# Patient Record
Sex: Female | Born: 1960 | Race: White | Hispanic: No | Smoking: Never smoker
Health system: Southern US, Community
[De-identification: ages and names within clinical notes are randomized; demographics above are authoritative.]

## PROBLEM LIST (undated history)

## (undated) DIAGNOSIS — E039 Hypothyroidism, unspecified: Secondary | ICD-10-CM

## (undated) DIAGNOSIS — F419 Anxiety disorder, unspecified: Secondary | ICD-10-CM

## (undated) DIAGNOSIS — F32A Depression, unspecified: Secondary | ICD-10-CM

## (undated) DIAGNOSIS — Z8719 Personal history of other diseases of the digestive system: Secondary | ICD-10-CM

## (undated) DIAGNOSIS — R200 Anesthesia of skin: Secondary | ICD-10-CM

## (undated) DIAGNOSIS — M179 Osteoarthritis of knee, unspecified: Secondary | ICD-10-CM

## (undated) DIAGNOSIS — K219 Gastro-esophageal reflux disease without esophagitis: Secondary | ICD-10-CM

## (undated) DIAGNOSIS — K649 Unspecified hemorrhoids: Secondary | ICD-10-CM

## (undated) DIAGNOSIS — F329 Major depressive disorder, single episode, unspecified: Secondary | ICD-10-CM

## (undated) DIAGNOSIS — K625 Hemorrhage of anus and rectum: Secondary | ICD-10-CM

## (undated) DIAGNOSIS — K589 Irritable bowel syndrome without diarrhea: Secondary | ICD-10-CM

## (undated) DIAGNOSIS — N200 Calculus of kidney: Secondary | ICD-10-CM

## (undated) DIAGNOSIS — Z87442 Personal history of urinary calculi: Secondary | ICD-10-CM

## (undated) DIAGNOSIS — N3281 Overactive bladder: Secondary | ICD-10-CM

## (undated) DIAGNOSIS — E7800 Pure hypercholesterolemia, unspecified: Secondary | ICD-10-CM

## (undated) DIAGNOSIS — D509 Iron deficiency anemia, unspecified: Secondary | ICD-10-CM

## (undated) DIAGNOSIS — M1710 Unilateral primary osteoarthritis, unspecified knee: Secondary | ICD-10-CM

## (undated) DIAGNOSIS — E785 Hyperlipidemia, unspecified: Secondary | ICD-10-CM

## (undated) DIAGNOSIS — G43909 Migraine, unspecified, not intractable, without status migrainosus: Secondary | ICD-10-CM

## (undated) DIAGNOSIS — K760 Fatty (change of) liver, not elsewhere classified: Secondary | ICD-10-CM

## (undated) HISTORY — DX: Pure hypercholesterolemia, unspecified: E78.00

## (undated) HISTORY — DX: Anxiety disorder, unspecified: F41.9

## (undated) HISTORY — DX: Calculus of kidney: N20.0

## (undated) HISTORY — PX: TUBAL LIGATION: SHX77

## (undated) HISTORY — DX: Major depressive disorder, single episode, unspecified: F32.9

## (undated) HISTORY — DX: Hypothyroidism, unspecified: E03.9

## (undated) HISTORY — DX: Depression, unspecified: F32.A

## (undated) HISTORY — PX: KIDNEY STONE SURGERY: SHX686

## (undated) HISTORY — DX: Hemorrhage of anus and rectum: K62.5

## (undated) HISTORY — DX: Irritable bowel syndrome, unspecified: K58.9

## (undated) HISTORY — PX: COLONOSCOPY: SHX174

---

## 1998-08-03 ENCOUNTER — Ambulatory Visit (HOSPITAL_COMMUNITY): Admission: RE | Admit: 1998-08-03 | Discharge: 1998-08-03 | Payer: Self-pay | Admitting: *Deleted

## 2004-11-25 HISTORY — PX: TOTAL ABDOMINAL HYSTERECTOMY: SHX209

## 2005-11-25 HISTORY — PX: SUPRACERVICAL ABDOMINAL HYSTERECTOMY: SHX5393

## 2009-12-20 ENCOUNTER — Encounter: Admission: RE | Admit: 2009-12-20 | Discharge: 2009-12-20 | Payer: Self-pay | Admitting: Family Medicine

## 2011-06-07 IMAGING — CT CT HEAD W/O CM
5 of 8 series · 17 of 47 positions shown, 19 images · non-contrast
Comparison: None.
COMPARISON: None.

CLINICAL DATA: Right side facial abrasions.  Trauma.

CT HEAD WITHOUT CONTRAST,CT MAXILLOFACIAL WITHOUT CONTRAST
TECHNIQUE: Contiguous axial images were obtained from the base of
the skull through the vertex without contrast.,Technique:
Multidetector CT imaging of the maxillofacial structures was
performed. Multiplanar CT image reconstructions were also
generated.
TECHNIQUE: Multidetector CT imaging of the maxillofacial
structures was performed.  Multiplanar CT image reconstructions
were also generated.  A small metallic BB was placed on the right
temple in order to reliably differentiate right from left.

[Series 2: head w/o · axial · non-contrast · 0.49mm/px · z∈[+53,+99]mm · 2 of 28 slices shown]
[im 10/28  brain]
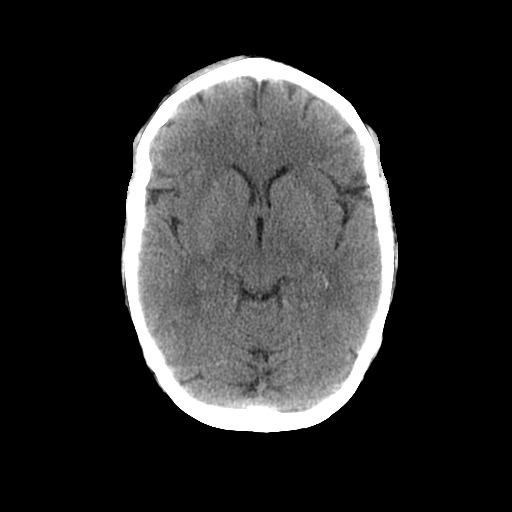
[im 19/28  brain]
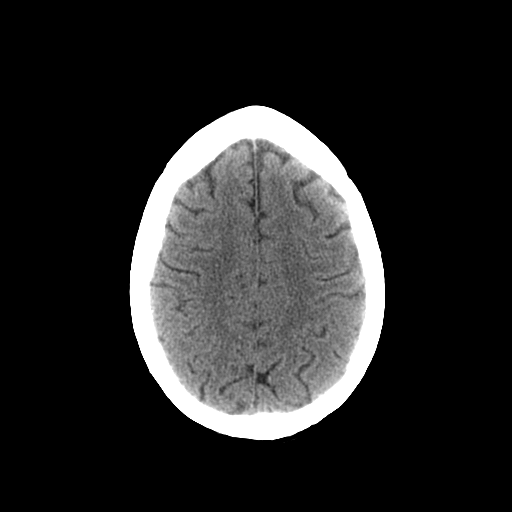

[Series 4: max soft · axial · 0.35mm/px · z∈[-91,+24]mm · 7 of 62 slices shown, 9 images]
[im 8/62  brain]
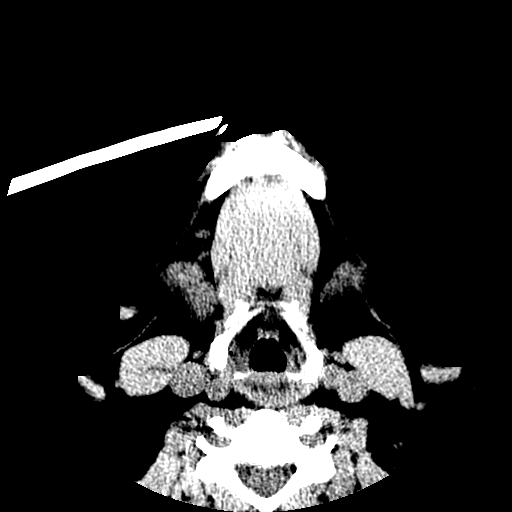
[im 8/62  bone]
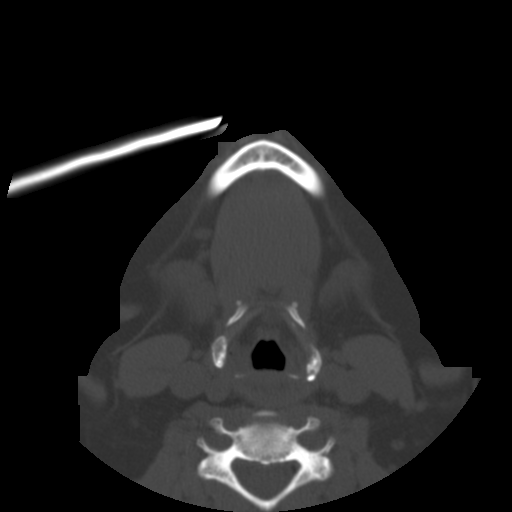
[im 16/62  brain]
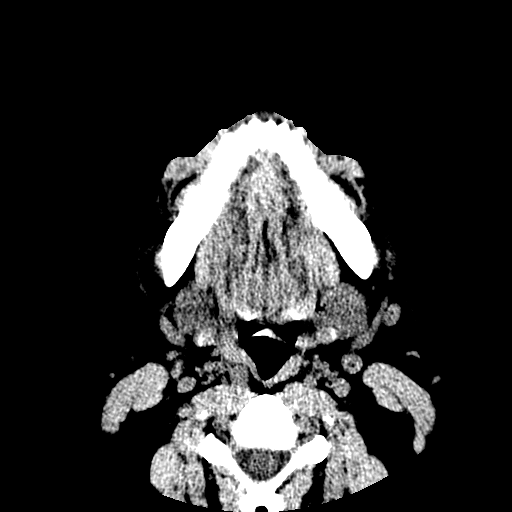
[im 23/62  brain]
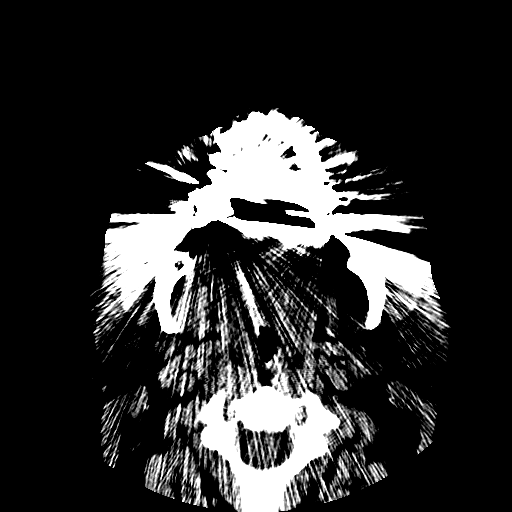
[im 31/62  brain]
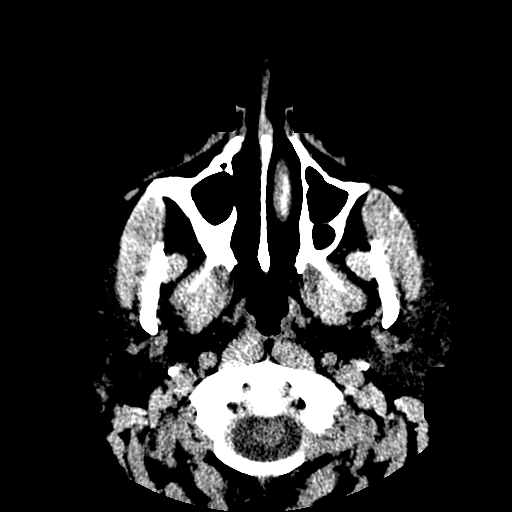
[im 39/62  brain]
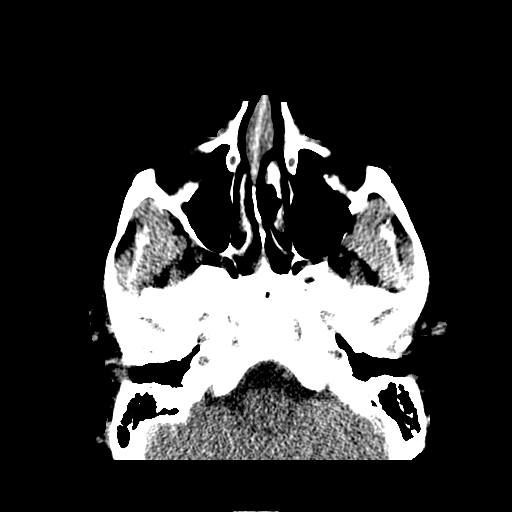
[im 39/62  bone]
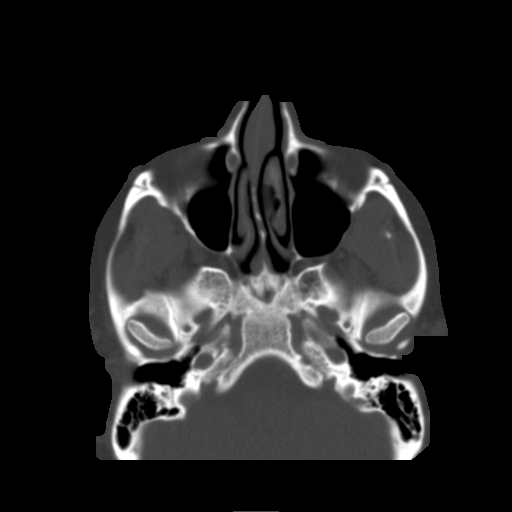
[im 46/62  brain]
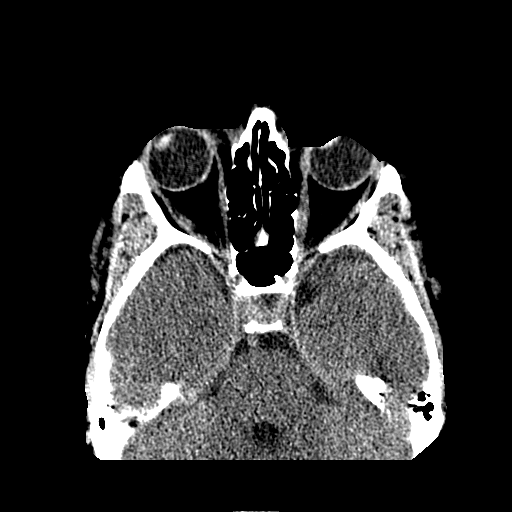
[im 54/62  brain]
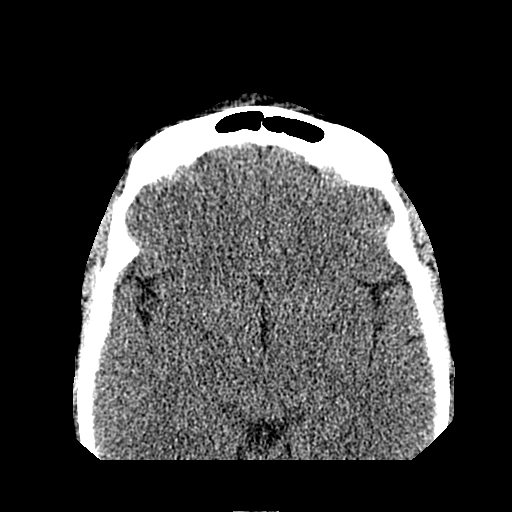

[Series 5: max bone · axial · 0.35mm/px · z∈[-91,-33]mm · 4 of 62 slices shown]
[im 8/62  bone]
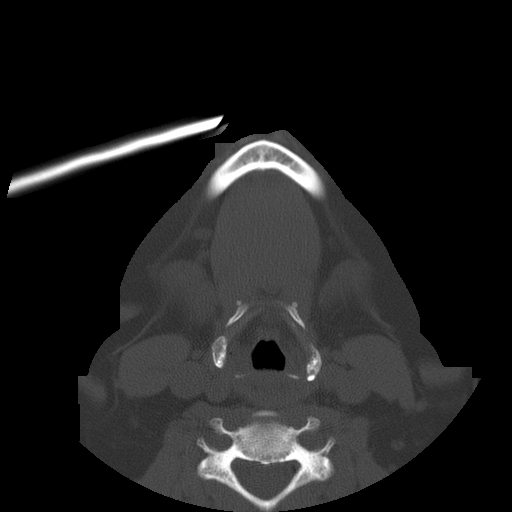
[im 16/62  bone]
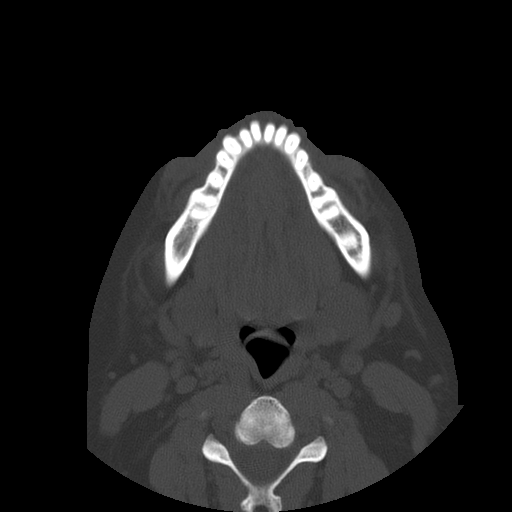
[im 23/62  bone]
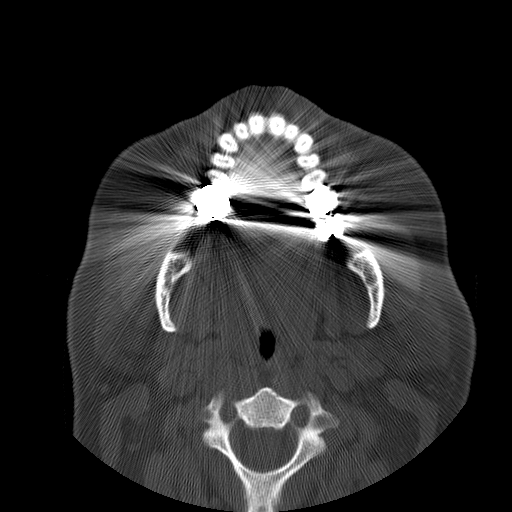
[im 31/62  bone]
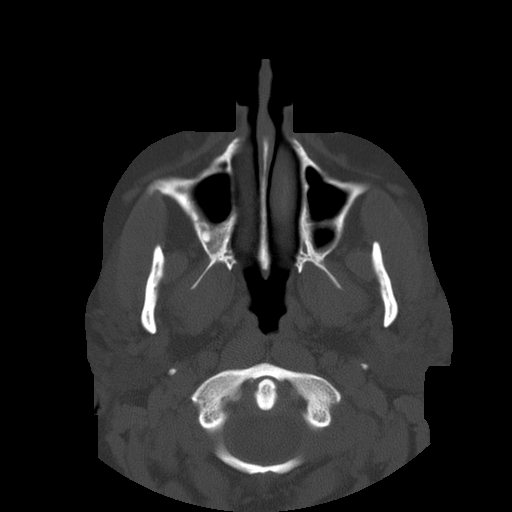

[Series 600: sag soft · sagittal · 0.35mm/px · 1 of 74 slices shown]
[im 37/74  brain]
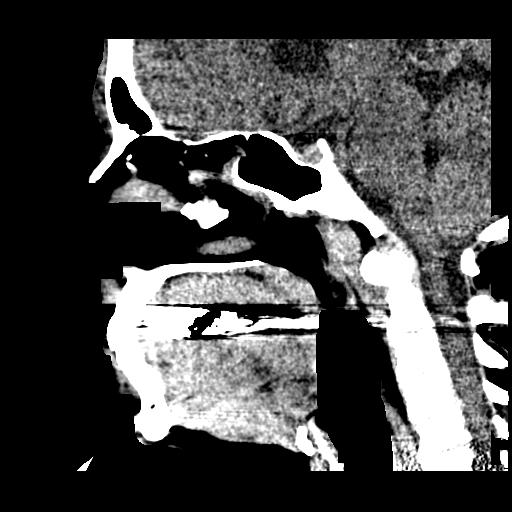

[Series 601: cor soft · coronal · 0.35mm/px · 3 of 78 slices shown]
[im 20/78  brain]
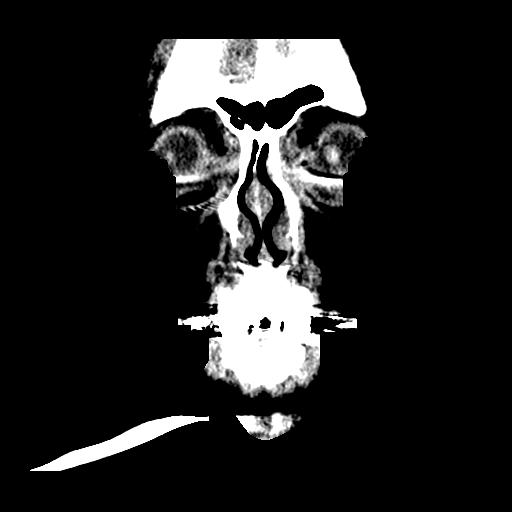
[im 39/78  brain]
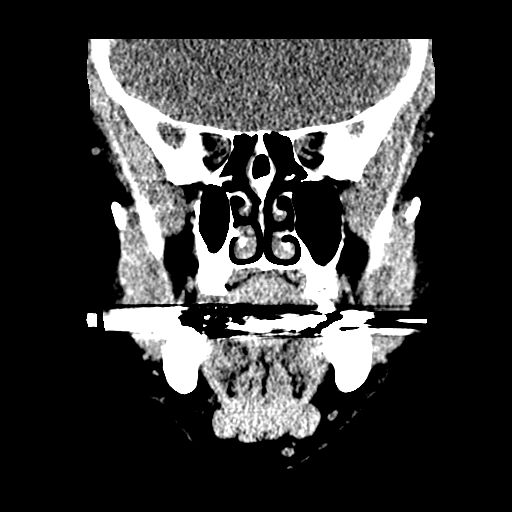
[im 58/78  brain]
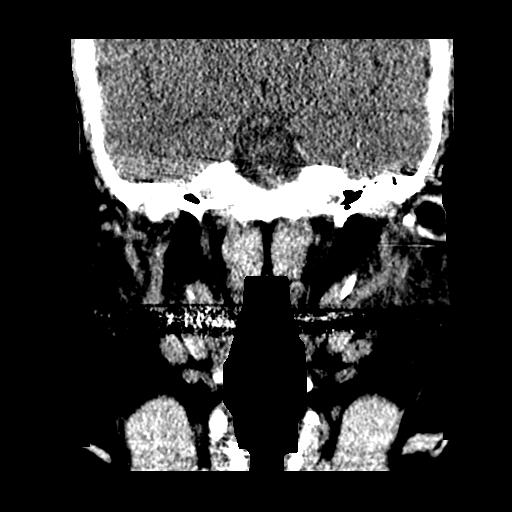

[17 of 47 positions shown; findings below may reference images not displayed]

FINDINGS: No acute intracranial findings.  Negative for hemorrhage,
mass lesion, or acute infarction.  Bony calvarium is intact.
Frontal scalp soft tissue swelling.
IMPRESSION: No acute intracranial abnormality.

CT MAXILLOFACIAL WITHOUT CONTRAST
FINDINGS: Frontal scalp soft tissue swelling.  No underlying
frontal bone fracture.  Bilateral maxillary sinus mucosal
thickening.  No sinus air fluid levels.  No fracture or bony
displacement.
IMPRESSION: No acute facial bone abnormality.

## 2016-05-09 ENCOUNTER — Ambulatory Visit: Payer: Self-pay | Admitting: Physician Assistant

## 2016-05-14 ENCOUNTER — Encounter: Payer: Self-pay | Admitting: Cardiovascular Disease

## 2016-05-20 ENCOUNTER — Encounter: Payer: Self-pay | Admitting: Cardiovascular Disease

## 2016-05-20 ENCOUNTER — Ambulatory Visit (INDEPENDENT_AMBULATORY_CARE_PROVIDER_SITE_OTHER): Payer: BLUE CROSS/BLUE SHIELD | Admitting: Cardiovascular Disease

## 2016-05-20 ENCOUNTER — Encounter (INDEPENDENT_AMBULATORY_CARE_PROVIDER_SITE_OTHER): Payer: Self-pay

## 2016-05-20 VITALS — BP 130/74 | HR 97 | Ht 62.0 in | Wt 203.4 lb

## 2016-05-20 DIAGNOSIS — E785 Hyperlipidemia, unspecified: Secondary | ICD-10-CM | POA: Insufficient documentation

## 2016-05-20 NOTE — Patient Instructions (Signed)
Medication Instructions:  Your physician recommends that you continue on your current medications as directed. Please refer to the Current Medication list given to you today.   Labwork: None Ordered   Testing/Procedures: None Ordered   Follow-Up: Your physician recommends that you schedule a follow-up appointment in: Lipid Clinic for management for your high cholesterol  Your physician wants you to follow-up in: 1 year with Dr. Elease HashimotoNahser.  You will receive a reminder letter in the mail two months in advance. If you don't receive a letter, please call our office to schedule the follow-up appointment.   If you need a refill on your cardiac medications before your next appointment, please call your pharmacy.   Thank you for choosing [facility]! Eligha BridegroomMichelle Makena Mcgrady, RN [phone]

## 2016-05-20 NOTE — Progress Notes (Signed)
Cardiology Office Note   Date:  05/20/2016   ID:  Destiny Jacobson, DOB [birth year], MRN [id number]  PCP:  REDMON,NOELLE, PA-C  Cardiologist:   Kristeen MissPhilip Jeweliana Dudgeon, MD   Chief Complaint  Patient presents with  . Hyperlipidemia   Problem list 1. Hyperlipidemia 2. Hypothyroidism   History of Present Illness: Destiny Jacobson is a 55 y.o. female who presents for Hyperlipidemia.   Destiny Jacobson is seen today for further evaluation of hyperlipidemia. She's been tried on Crestor in the past but had lots of leg pain. She also has been tried on Zetia.  [facility] a  cholesterol level of 299.  Triglyceride level is 279.    HDL is 53. calculated LDL is 190.    non-HDL is [id number].  Tries to watch her diet  Tries to work out but does not go to [employer]  Works as a Conservation officer, naturecashier at [employer]   Past Medical History:  Diagnosis Date  . Anxiety and depression   . Elevated cholesterol   . Hypothyroidism   . IBS (irritable bowel syndrome)   . Kidney stones   . Rectal bleeding     Past Surgical History:  Procedure Laterality Date  . KIDNEY STONE SURGERY  2013 OR 2014  . TOTAL ABDOMINAL HYSTERECTOMY  11/2004   [place]  . TUBAL LIGATION       Current Outpatient Prescriptions  Medication Sig Dispense Refill  . ezetimibe (ZETIA) 10 MG tablet Take 10 mg by mouth daily.    Marland Kitchen. FLUoxetine (PROZAC) 20 MG capsule Take 20 mg by mouth 3 (three) times daily.    . hydrocortisone (ANUSOL-HC) 25 MG suppository Place 25 mg rectally 2 (two) times daily as needed for hemorrhoids or itching.    . levothyroxine (SYNTHROID, LEVOTHROID) 175 MCG tablet Take 175 mcg by mouth daily before breakfast.    . Multiple Vitamin (MULTIVITAMIN) tablet Take 1 tablet by mouth daily.    . rosuvastatin (CRESTOR) 40 MG tablet Take 40 mg by mouth daily.    . Wheat Dextrin (BENEFIBER) POWD Take by mouth as directed.    . zolpidem (AMBIEN) 10 MG tablet Take 10 mg by mouth at bedtime as  needed for sleep.     No current facility-administered medications for this visit.     Allergies:   Amoxicillin and Valacyclovir hcl    Social History:  The patient  reports that she has never smoked. She has never used smokeless tobacco. She reports that she drinks alcohol. She reports that she does not use drugs.   Family History:  The patient's family history includes CAD in her mother; Heart attack (age of onset: 1069) in her mother; Hyperlipidemia in her sister.    ROS:  Please see the history of present illness.    Review of Systems: Constitutional:  denies fever, chills, diaphoresis, appetite change and fatigue.  HEENT: denies photophobia, eye pain, redness, hearing loss, ear pain, congestion, sore throat, rhinorrhea, sneezing, neck pain, neck stiffness and tinnitus.  Respiratory: denies SOB, DOE, cough, chest tightness, and wheezing.  Cardiovascular: denies chest pain, palpitations and leg swelling.  Gastrointestinal: denies nausea, vomiting, abdominal pain, diarrhea, constipation, blood in stool.  Genitourinary: denies dysuria, urgency, frequency, hematuria, flank pain and difficulty urinating.  Musculoskeletal: denies  myalgias, back pain, joint swelling, arthralgias and gait problem.   Skin: denies pallor, rash and wound.  Neurological: denies dizziness, seizures, syncope, weakness, light-headedness, numbness and headaches.   Hematological: denies adenopathy, easy bruising,  personal or family bleeding history.  Psychiatric/ Behavioral: denies suicidal ideation, mood changes, confusion, nervousness, sleep disturbance and agitation.       All other systems are reviewed and negative.    PHYSICAL EXAM: VS:  BP 130/74 (BP Location: Left Leg, Patient Position: Sitting, Cuff Size: Normal) Comment: Right/Regular/Sitting 120/50  Pulse 97   Ht 5\' 2"  (1.575 m)   Wt 203 lb 6.4 oz (92.3 kg)   SpO2 94%   BMI 37.20 kg/m  , BMI Body mass index is 37.2 kg/m. GEN: Obese female in  no acute distress  HEENT: normal  Neck: no JVD, carotid bruits, or masses Cardiac: RRR; no murmurs, rubs, or gallops,no edema  Respiratory:  clear to auscultation bilaterally, normal work of breathing GI: Her abdomen is obese. soft, nontender, nondistended, + BS MS: no deformity or atrophy  Skin: warm and dry, no rash Neuro:  Strength and sensation are intact Psych: normal   EKG:  EKG is ordered today. The ekg ordered today demonstrates normal sinus rhythm at 98. She has an incomplete right bundle-branch block.   Recent Labs: No results found for requested labs within last 8760 hours.    Lipid Panel No results found for: CHOL, TRIG, HDL, CHOLHDL, VLDL, LDLCALC, LDLDIRECT    Wt Readings from Last 3 Encounters:  05/20/16 203 lb 6.4 oz (92.3 kg)      Other studies Reviewed: Additional studies/ records that were reviewed today include: . Review of the above records demonstrates:    ASSESSMENT AND PLAN:  1.  Hyperlipidemia: Destiny Jacobson presents for further evaluation of hyperlipidemia. She has not been able to tolerate statins. We will refer her to our lipid clinic for consideration for repacking.  We have discussed the importance of a good diet, exercise, and weight loss program. I think that she has some room to improve in those  areas.  I'll see her in one year.   Current medicines are reviewed at length with the patient today.  The patient does not have concerns regarding medicines.  Labs/ tests ordered today include:  No orders of the defined types were placed in this encounter.    Disposition:   FU with me in 1 year      Kristeen Miss, MD  05/20/2016 4:12 PM    [facility] [address] [place], [place], [place]  [address] Phone: [phone]; Fax: [phone]   [facility]  [address] [place], [place]  [address] [phone]   Fax [phone]

## 2016-05-21 NOTE — Progress Notes (Deleted)
Patient ID: Destiny SpanJacqueline M Accomando                 DOB: November 23, 1960                    MRN: [id number]     HPI: Destiny Jacobson is a 55 y.o. female patient referred to lipid clinic by Dr. Elease HashimotoNahser. PMH is significant for HLD and anxiety/depression.  Other statins tried? Zetia and Crestor 40mg  still listed on med list  [employer] insurance  Current Medications:  Intolerances: Crestor, Zetia - leg myalgias Risk Factors:  LDL goal:   Diet:   Exercise:   Family History: The patient's family history includes CAD in her mother; Heart attack (age of onset: 8469) in her mother; Hyperlipidemia in her sister.   Social History: The patient  reports that she has never smoked. She has never used smokeless tobacco. She reports that she drinks alcohol. She reports that she does not use drugs.   Labs: TC 299, TG 279, HDL 53, LDL 190, non-HDL 245  Past Medical History:  Diagnosis Date  . Anxiety and depression   . Elevated cholesterol   . Hypothyroidism   . IBS (irritable bowel syndrome)   . Kidney stones   . Rectal bleeding     Current Outpatient Prescriptions on File Prior to Visit  Medication Sig Dispense Refill  . ezetimibe (ZETIA) 10 MG tablet Take 10 mg by mouth daily.    Marland Kitchen. FLUoxetine (PROZAC) 20 MG capsule Take 20 mg by mouth 3 (three) times daily.    . hydrocortisone (ANUSOL-HC) 25 MG suppository Place 25 mg rectally 2 (two) times daily as needed for hemorrhoids or itching.    . levothyroxine (SYNTHROID, LEVOTHROID) 175 MCG tablet Take 175 mcg by mouth daily before breakfast.    . Multiple Vitamin (MULTIVITAMIN) tablet Take 1 tablet by mouth daily.    . rosuvastatin (CRESTOR) 40 MG tablet Take 40 mg by mouth daily.    . Wheat Dextrin (BENEFIBER) POWD Take by mouth as directed.    . zolpidem (AMBIEN) 10 MG tablet Take 10 mg by mouth at bedtime as needed for sleep.     No current facility-administered medications on file prior to visit.     Allergies  Allergen Reactions  .  Amoxicillin Diarrhea and Nausea And Vomiting  . Valacyclovir Hcl Other (See Comments)    ABDOMINAL PAIN     Assessment/Plan:

## 2016-05-22 ENCOUNTER — Ambulatory Visit: Payer: BLUE CROSS/BLUE SHIELD | Admitting: Pharmacist

## 2016-06-11 ENCOUNTER — Ambulatory Visit: Payer: BLUE CROSS/BLUE SHIELD | Admitting: Cardiovascular Disease

## 2016-07-02 ENCOUNTER — Ambulatory Visit: Payer: BLUE CROSS/BLUE SHIELD | Admitting: Cardiovascular Disease

## 2016-07-09 ENCOUNTER — Encounter (INDEPENDENT_AMBULATORY_CARE_PROVIDER_SITE_OTHER): Payer: Self-pay

## 2016-07-09 ENCOUNTER — Ambulatory Visit (INDEPENDENT_AMBULATORY_CARE_PROVIDER_SITE_OTHER): Payer: BLUE CROSS/BLUE SHIELD | Admitting: Pharmacist

## 2016-07-09 DIAGNOSIS — E785 Hyperlipidemia, unspecified: Secondary | ICD-10-CM | POA: Diagnosis not present

## 2016-07-09 MED ORDER — ROSUVASTATIN CALCIUM 10 MG PO TABS
10.0000 mg | ORAL_TABLET | Freq: Every day | ORAL | 11 refills | Status: AC
Start: 2016-07-09 — End: 2016-10-07

## 2016-07-09 NOTE — Progress Notes (Addendum)
Patient ID: Destiny SpanJacqueline M Doubleday                 DOB: 11/04/1960                    MRN: [id number]     HPI: Destiny Jacobson is a 55 y.o. female patient referred to lipid clinic by Dr Elease HashimotoNahser. PMH is significant for HLD, anxiety and depression, IBS, and hypothyroidism. She has a history of statin intolerance and baseline LDL of 190. She presents today for lipid management.  Pt reports that her PCP started her on Crestor 40mg  and Zetia 10mg  at the same time. She started to experience muscle aches about week after. They resolved once pt discontinued therapy. She has never tried any other statin medications or lower dose of Crestor.  She has been trying to eat healthier. Has cut back on soda and red meat and has started to drink more water and eat more salads.  Current Medications: Benefiber powder Intolerances: Crestor 40mg  and Zetia 10mg  daily - leg aches Risk Factors: LDL of 190, family history of CAD LDL goal: 100mg /dL  Diet: Drinking more water and eating more salads. Has cut back on soda and red meat.  Exercise: Some knee problems - limits activity level.  Family History: The patient's family history includes CAD in her mother; Heart attack (age of onset: 4669) in her mother; Hyperlipidemia in her sister.   Social History: The patient  reports that she has never smoked. She has never used smokeless tobacco. She reports that she drinks alcohol. She reports that she does not use drugs.  Labs: [facility]: TC 299, TG 279, HDL 53, LDL 190, non-HDL 245  Past Medical History:  Diagnosis Date  . Anxiety and depression   . Elevated cholesterol   . Hypothyroidism   . IBS (irritable bowel syndrome)   . Kidney stones   . Rectal bleeding     Current Outpatient Prescriptions on File Prior to Visit  Medication Sig Dispense Refill  . ezetimibe (ZETIA) 10 MG tablet Take 10 mg by mouth daily.    Marland Kitchen. FLUoxetine (PROZAC) 20 MG capsule Take 20 mg by mouth 3  (three) times daily.    . hydrocortisone (ANUSOL-HC) 25 MG suppository Place 25 mg rectally 2 (two) times daily as needed for hemorrhoids or itching.    . levothyroxine (SYNTHROID, LEVOTHROID) 175 MCG tablet Take 175 mcg by mouth daily before breakfast.    . Multiple Vitamin (MULTIVITAMIN) tablet Take 1 tablet by mouth daily.    . rosuvastatin (CRESTOR) 40 MG tablet Take 40 mg by mouth daily.    . Wheat Dextrin (BENEFIBER) POWD Take by mouth as directed.    . zolpidem (AMBIEN) 10 MG tablet Take 10 mg by mouth at bedtime as needed for sleep.     No current facility-administered medications on file prior to visit.     Allergies  Allergen Reactions  . Amoxicillin Diarrhea and Nausea And Vomiting  . Valacyclovir Hcl Other (See Comments)    ABDOMINAL PAIN     Assessment/Plan:  1. Hyperlipidemia - LDL 190mg /dL above goal 100mg /dL for primary prevention with family hx of CAD. Pt was intolerant to Crestor 40mg  and Zetia 10mg  daily which were started at the same time. She wanted to try PCSK9i however insurance will not cover injections since she does not have ASCVD or FH. She has never tried a lower dose of Crestor or any other statins. She  is willing to try Crestor 10mg  daily. Will continue to work on dietary improvements. Recheck lipid panel in 3 months.   Megan E. Supple, PharmD, CPP, BCACP [facility] [address], [place], [place] [address] Phone: [phone]; Fax: [phone] 07/09/2016 11:52 AM

## 2016-07-09 NOTE — Patient Instructions (Signed)
Start taking Crestor (rosuvastatin) 10mg  daily  We will recheck your cholesterol in 2 months on Monday February 19th. Come in any time between 7:30am and 4pm for fasting blood work.  Call Jamaris Biernat in lipid clinic with any questions #[phone].

## 2016-07-14 ENCOUNTER — Telehealth: Payer: Self-pay | Admitting: Pharmacist

## 2016-07-14 NOTE — Telephone Encounter (Signed)
Received call from [facility] with more details of lipid-lowering therapy pt had previously tried. They reported that pt was on Lipitor 80mg  daily from 2009 - 2011. Then she was switched to Crestor because of lack of efficacy of the Lipitor. Crestor was titrated up from 10mg  to 20mg  to 40mg  daily. Zetia was then added in 2011. No mention of myalgias from PCP. Question patient's adherence because there should have been a significant drop in LDL if she had been adherent to either max dose Crestor or Lipitor.  Pt had reported that she was started on Crestor 40mg  and Zetia 10mg  at the same time and that she had not tried a lower dose of Crestor. She was willing retry lower dose of Crestor 10mg  daily. Will keep current lab appt for February and assess LDL at that time. May try up-titrating Crestor again at that time pending lab results.

## 2016-07-28 DIAGNOSIS — M1712 Unilateral primary osteoarthritis, left knee: Secondary | ICD-10-CM | POA: Insufficient documentation

## 2016-09-15 ENCOUNTER — Other Ambulatory Visit: Payer: BLUE CROSS/BLUE SHIELD

## 2016-09-19 ENCOUNTER — Telehealth: Payer: Self-pay | Admitting: Pharmacist

## 2016-09-19 NOTE — Telephone Encounter (Signed)
LMOM for pt to return call regarding missed lab appt to check lipids on Crestor 10mg  daily.

## 2016-10-01 NOTE — Telephone Encounter (Signed)
LMOM again for pt to return call to reschedule missed lab work.

## 2018-02-05 DIAGNOSIS — M1711 Unilateral primary osteoarthritis, right knee: Secondary | ICD-10-CM | POA: Insufficient documentation

## 2018-06-14 ENCOUNTER — Ambulatory Visit: Payer: Self-pay | Admitting: Surgery

## 2018-06-14 NOTE — H&P (Signed)
Destiny Jacobson Documented: 06/14/2018 8:36 AM Location: [facility] Patient #: [id number] DOB: 04-Jun-1961 Single / Language: [place] / Race: White Female  History of Present Illness Destiny Jacobson; 06/14/2018 2:16 PM) The patient is a 57 year old female who presents with hemorrhoids. Note for "Hemorrhoids": ` ` ` Patient sent for surgical consultation at the request of Destiny Jacobson, [place]  Chief Complaint: worsening hemorrhoids ` ` The patient is a pleasant woman history it was hemorrhoids for many decades per essentially since she had her child. Usually has some intermittent bleeding but mostly manageable. Followed by Dr. Dulce Jacobson with gastroenterology. Colonoscopy about 5 years ago was underwhelming. Moves her bowels about every other day. And intermittent bleeding hemorrhoids. Surgical consultation requested. Absorbable my partner, Destiny Jacobson, 2 years ago. External hemorrhoids noted. Fiber bowel regimen recommended in holding off on surgical intervention. Patient notes that she was using pads and topical agents to try and manage it. However to progressively worsen. Feels like there is something out all the time. Wearing diapers. Bleeding. Has had severe bleeding episodes at work. Frustrating and embarrassing. Some discomfort with bowel movements and stinging. Based on concerns, surgical consultation requested.  No personal nor family history of GI/colon cancer, inflammatory bowel disease, irritable bowel syndrome, allergy such as Celiac Sprue, dietary/dairy problems, colitis, ulcers nor gastritis. No recent sick contacts/gastroenteritis. No travel outside the country. No changes in diet. No dysphagia to solids or liquids. No significant heartburn or reflux. No melena, hematemesis, coffee ground emesis. No evidence of prior gastric/peptic ulceration.  (Review of systems as stated in this history (HPI) or in the review of systems. Otherwise all other  12 point ROS are negative) ` ` `   Allergies Destiny Jacobson, [place]; 06/14/2018 8:37 AM) Amoxicillin *PENICILLINS* Allergies Reconciled  Medication History Destiny Jacobson; 06/14/2018 8:38 AM) Zolpidem Tartrate (10MG  Tablet, Oral) Active. Procto-Med HC (2.5% Cream, 1 (one) Rectal two times daily, Taken starting 12/12/2015) Active. FLUoxetine HCl (20MG  Capsule, Oral) Active. Levothyroxine Sodium ( Tablet, Oral) Active. Fish Oil + D3 (1000-1000MG -UNIT Capsule, Oral) Active. Crestor (10MG  Tablet, Oral) Active. Medications Reconciled    Vitals Destiny Jacobson; 06/14/2018 8:37 AM) 06/14/2018 8:37 AM Weight: 205.5 lb Jacobson: 62in Body Surface Area: 1.93 m Body Mass Index: 37.59 kg/m  Temp.: 98.43F  Pulse: 96 (Regular)  BP: 132/80 (Sitting, Left Arm, Standard)      Physical Exam Destiny Jacobson; 06/14/2018 9:32 AM)  General Mental Status-Alert. General Appearance-Not in acute distress, Not Sickly. Orientation-Oriented X3. Hydration-Well hydrated. Voice-Normal.  Integumentary Global Assessment Upon inspection and palpation of skin surfaces of the - Axillae: non-tender, no inflammation or ulceration, no drainage. and Distribution of scalp and body hair is normal. General Characteristics Temperature - normal warmth is noted.  Head and Neck Head-normocephalic, atraumatic with no lesions or palpable masses. Face Global Assessment - atraumatic, no absence of expression. Neck Global Assessment - no abnormal movements, no bruit auscultated on the right, no bruit auscultated on the left, no decreased range of motion, non-tender. Trachea-midline. Thyroid Gland Characteristics - non-tender.  Eye Eyeball - Left-Extraocular movements intact, No Nystagmus. Eyeball - Right-Extraocular movements intact, No Nystagmus. Cornea - Left-No Hazy. Cornea - Right-No Hazy. Sclera/Conjunctiva - Left-No scleral icterus, No  Discharge. Sclera/Conjunctiva - Right-No scleral icterus, No Discharge. Pupil - Left-Direct reaction to light normal. Pupil - Right-Direct reaction to light normal.  ENMT Ears Pinna - Left - no drainage observed, no generalized tenderness observed. Right - no drainage observed, no generalized tenderness  observed. Nose and Sinuses External Inspection of the Nose - no destructive lesion observed. Inspection of the nares - Left - quiet respiration. Right - quiet respiration. Mouth and Throat Lips - Upper Lip - no fissures observed, no pallor noted. Lower Lip - no fissures observed, no pallor noted. Nasopharynx - no discharge present. Oral Cavity/Oropharynx - Tongue - no dryness observed. Oral Mucosa - no cyanosis observed. Hypopharynx - no evidence of airway distress observed.  Chest and Lung Exam Inspection Movements - Normal and Symmetrical. Accessory muscles - No use of accessory muscles in breathing. Palpation Palpation of the chest reveals - Non-tender. Auscultation Breath sounds - Normal and Clear.  Cardiovascular Auscultation Rhythm - Regular. Murmurs & Other Heart Sounds - Auscultation of the heart reveals - No Murmurs and No Systolic Clicks.  Abdomen Inspection Inspection of the abdomen reveals - No Visible peristalsis and No Abnormal pulsations. Umbilicus - No Bleeding, No Urine drainage. Palpation/Percussion Palpation and Percussion of the abdomen reveal - Soft, Non Tender, No Rebound tenderness, No Rigidity (guarding) and No Cutaneous hyperesthesia. Note: Morbidly obese but nontender. Mild diastases recti. Abdomen soft. Not severely distended. Low midline incision consistent with prior partial hysterectomy and oophorectomy. No umbilical or other anterior abdominal wall hernias  Female Genitourinary Sexual Maturity Tanner 5 - Adult hair pattern. Note: No vaginal bleeding nor discharge. No major inguinal lymphadenopathy. No inguinal hernias  Rectal Note: Please  refer to anoscopy section.  Overall, 3 pile enlarged hemorrhoids with significant external component. Right posterior grade 4. Left lateral grade through easily prolapse. Grade 2 at least possible 3 right anterior as well. No fissure. Normal sphincter tone. No abscess. No major pruritus. No obvious rectal masses felt to 7 cm on exam. No stricture.  Peripheral Vascular Upper Extremity Inspection - Left - No Cyanotic nailbeds, Not Ischemic. Right - No Cyanotic nailbeds, Not Ischemic.  Neurologic Neurologic evaluation reveals -normal attention span and ability to concentrate, able to name objects and repeat phrases. Appropriate fund of knowledge , normal sensation and normal coordination. Mental Status Affect - not angry, not paranoid. Cranial Nerves-Normal Bilaterally. Gait-Normal.  Neuropsychiatric Mental status exam performed with findings of-able to articulate well with normal speech/language, rate, volume and coherence, thought content normal with ability to perform basic computations and apply abstract reasoning and no evidence of hallucinations, delusions, obsessions or homicidal/suicidal ideation.  Musculoskeletal Global Assessment Spine, Ribs and Pelvis - no instability, subluxation or laxity. Right Upper Extremity - no instability, subluxation or laxity.  Lymphatic Head & Neck  General Head & Neck Lymphatics: Bilateral - Description - No Localized lymphadenopathy. Axillary  General Axillary Region: Bilateral - Description - No Localized lymphadenopathy. Femoral & Inguinal  Generalized Femoral & Inguinal Lymphatics: Left - Description - No Localized lymphadenopathy. Right - Description - No Localized lymphadenopathy.   Results Destiny Jacobson; 06/14/2018 2:17 PM) Procedures  Name Value Date Hemorrhoids Procedure Anal exam: External Hemorrhoid prolapse Internal exam: Internal Hemorrhoids (bleeding) prolapse Other: Overall, 3 pile  enlarged hemorrhoids with significant external component. Right posterior grade 4. Left lateral grade through easily prolapse. Grade 2 at least possible 3 right anterior as well.....Marland KitchenMarland KitchenNo fissure. Normal sphincter tone. No abscess. No major pruritus. No obvious rectal masses felt to 7 cm on exam. No stricture.  Performed: 06/14/2018 9:33 AM    Assessment & Plan Destiny Jacobson; 06/14/2018 2:17 PM)  PROLAPSED INTERNAL HEMORRHOIDS, GRADE 4 (K64.3) Impression: Worsening hemorrhoids with right posterior chronically prolapsed out. Left lateral prolapses easily. Right anterior almost does  as well. Significant external piles. No other etiologies.  Because she has worsening bleeding despite decent bowel regimen and prior evaluation with colonoscopy showing no other major etiologies, I think this requires intervention. These are too large and prolapsed for banding to be effective. Banding usually less effective against bleeding hemorrhoids anyway. I think this requires surgery. Hexagonal columnar hemorrhoidal ligation and pexy with hemorrhoidectomy of any remaining tissue. Suspect at least 1 if not 2 piles would require some partial excision as well to get Hernando be due better place. I did caution she was pain or discomfort with this but it it is what is needed to get her control. She suspected as much. She has family members and needed this. We will work to coordinate a convenient time   INTERNAL BLEEDING HEMORRHOIDS (K64.8)   PROLAPSED INTERNAL HEMORRHOIDS, GRADE 3 (K64.2)  Current Plans ANOSCOPY, DIAGNOSTIC (29562) You are being scheduled for surgery- Our schedulers will call you.  You should hear from our office's scheduling department within 5 working days about the location, date, and time of surgery. We try to make accommodations for patient's preferences in scheduling surgery, but sometimes the OR schedule or the surgeon's schedule prevents [place] from making those  accommodations.  If you have not heard from our office [phone]) in 5 working days, call the office and ask for your surgeon's nurse.  If you have other questions about your diagnosis, plan, or surgery, call the office and ask for your surgeon's nurse.  Pt Education - Pamphlet Given - The Hemorrhoid Book: discussed with patient and provided information. Pt Education - [facility] Hemorrhoids (Ayzia Day): discussed with patient and provided information.  ENCOUNTER FOR PREOPERATIVE EXAMINATION FOR GENERAL SURGICAL PROCEDURE (Z01.818)  Current Plans You are being scheduled for surgery- Our schedulers will call you.  You should hear from our office's scheduling department within 5 working days about the location, date, and time of surgery. We try to make accommodations for patient's preferences in scheduling surgery, but sometimes the OR schedule or the surgeon's schedule prevents [place] from making those accommodations.  If you have not heard from our office [phone]) in 5 working days, call the office and ask for your surgeon's nurse.  If you have other questions about your diagnosis, plan, or surgery, call the office and ask for your surgeon's nurse.  The anatomy & physiology of the anorectal region was discussed. The pathophysiology of hemorrhoids and differential diagnosis was discussed. Natural history risks without surgery was discussed. I stressed the importance of a bowel regimen to have daily soft bowel movements to minimize progression of disease. Interventions such as sclerotherapy & banding were discussed.  The patient's symptoms are not adequately controlled by medicines and other non-operative treatments. I feel the risks & problems of no surgery outweigh the operative risks; therefore, I recommended surgery to treat the hemorrhoids by ligation, pexy, and possible resection.  Risks such as bleeding, infection, urinary difficulties, need for further treatment, heart attack,  death, and other risks were discussed. I noted a good likelihood this will help address the problem. Goals of post-operative recovery were discussed as well. Possibility that this will not correct all symptoms was explained. Post-operative pain, bleeding, constipation, and other problems after surgery were discussed. We will work to minimize complications. Educational handouts further explaining the pathology, treatment options, and bowel regimen were given as well. Questions were answered. The patient expresses understanding & wishes to proceed with surgery.  Pt Education - [facility] Rectal Prep for Anorectal outpatient/office surgery: discussed with  patient and provided information. Pt Education - [facility] Rectal Surgery HCI ([place] Leinen): discussed with patient and provided information. Pt Education - [facility] Good Bowel Health (Brax Walen)  Destiny SportsmanSteven C. Lyndsay Talamante, Jacobson, FACS, MASCRS Gastrointestinal and Minimally Invasive Surgery    [address] [place] [id number] [phone] [address] / Paging [phone] Fax

## 2019-01-12 ENCOUNTER — Other Ambulatory Visit: Payer: Self-pay | Admitting: Physician Assistant

## 2019-01-12 DIAGNOSIS — R1084 Generalized abdominal pain: Secondary | ICD-10-CM

## 2019-01-26 ENCOUNTER — Other Ambulatory Visit: Payer: BLUE CROSS/BLUE SHIELD

## 2019-05-10 ENCOUNTER — Ambulatory Visit: Payer: Self-pay | Admitting: Surgery

## 2019-05-10 NOTE — H&P (Signed)
Destiny Jacobson Documented: 05/10/2019 9:21 AM Location: [facility] Patient #: [id number] DOB: Nov 04, 1960 Single / Language: Destiny Jacobson / Race: White Female  History of Present Illness Destiny Hector MD; 05/10/2019 9:40 AM) The patient is a 58 year old female who presents with hemorrhoids. Note for "Hemorrhoids": ` ` ` Patient sent for surgical consultation at the request of Destiny Jacobson, [place]  Chief Complaint: Persistent hemorrhoids ` ` Patient returns almost a year from the last visit. I had seen her last year for hemorrhoid problems with bleeding. Destiny Jacobson was prolapsed out. I felt these were too far gone to try and control just banding only. Destiny Jacobson attached [place] catheter out of feeling it. However Destiny Jacobson's noted the hemorrhoids have persistently been a problem. He still somewhat constipated moving her bowels about 2-3 times a week. However it is Destiny Jacobson and Destiny Jacobson. Destiny Jacobson Does Confess Destiny Jacobson's Not As Compliant As Destiny Jacobson Would like to Be. Still Occasionally Needs Tramadol for Chronic Pain Issues. No Major Bleeding. Because Hemorrhoids Have Been Persistent Problem with Irritation and Bleeding and Prolapsing, Destiny Jacobson Comes Back into Reconsider My Advice  PRIOR NOTE: The patient is a pleasant woman history it was hemorrhoids for many decades per essentially since Destiny Jacobson had her child. Usually has some intermittent bleeding but mostly manageable. Followed by Dr. Paulita Jacobson with gastroenterology. Colonoscopy about 5 years ago was underwhelming. Moves her bowels about every other day. And intermittent bleeding hemorrhoids. Surgical consultation requested. Absorbable my partner, Destiny Jacobson, 2 years ago. External hemorrhoids noted. Fiber bowel regimen recommended in holding off on surgical intervention. Patient notes that Destiny Jacobson was using pads and topical agents to try and manage it. However to progressively worsen. Feels like there is  something out all the time. Wearing diapers. Bleeding. Has had severe bleeding episodes at work. Frustrating and embarrassing. Some discomfort with bowel movements and stinging. Based on concerns, surgical consultation requested.  No personal nor family history of GI/colon cancer, inflammatory bowel disease, irritable bowel syndrome, allergy such as Celiac Sprue, dietary/dairy problems, colitis, ulcers nor gastritis. No recent sick contacts/gastroenteritis. No travel outside the country. No changes in diet. No dysphagia to solids or liquids. No significant heartburn or reflux. No melena, hematemesis, coffee ground emesis. No evidence of prior gastric/peptic ulceration.  (Review of systems as stated in this history (HPI) or in the review of systems. Otherwise all other 12 point ROS are negative) ` ` `   Problem List/Past Medical Destiny Hector, MD; 05/10/2019 9:39 AM) INFLAMED EXTERNAL HEMORRHOID (K64.4) PROLAPSED INTERNAL HEMORRHOIDS, GRADE 4 (K64.3) INTERNAL BLEEDING HEMORRHOIDS (K64.8) PROLAPSED INTERNAL HEMORRHOIDS, GRADE 3 (K64.2) ENCOUNTER FOR PREOPERATIVE EXAMINATION FOR GENERAL SURGICAL PROCEDURE (O53.664)  Past Surgical History Destiny Hector, MD; 05/10/2019 9:39 AM) Hysterectomy (not due to cancer) - Partial  Diagnostic Studies History Destiny Hector, MD; 05/10/2019 9:39 AM) Colonoscopy within last year Mammogram 1-3 years ago Pap Smear 1-5 years ago  Allergies Destiny Jacobson, [place]; 05/10/2019 9:24 AM) Allergies Reconciled  Medication History Destiny Jacobson, Destiny Jacobson; 05/10/2019 9:28 AM) FLUoxetine HCl (20MG  Capsule, Oral) Active. Levothyroxine Sodium (175MCG Tablet, Oral) Active. Multiple Vitamins/Iron (1 (one) Oral) Active. PROzac (20MG  Capsule, Oral) Active. traMADol HCl (50MG  Tablet, Oral) Active. Zofran (4MG  Tablet, Oral) Active. Rosuvastatin Calcium (5MG  Tablet, Oral) Active. Medications Reconciled  Social History Destiny Hector, MD;  05/10/2019 9:39 AM) Alcohol use Moderate alcohol use. Caffeine use Carbonated beverages, Tea. Illicit drug use Remotely quit drug use. Tobacco use Never smoker.  Family History Destiny Sportsman, MD; 05/10/2019 9:39 AM) Alcohol Abuse Mother. Cervical Cancer Mother. Depression Brother, Father, Mother. Family history unknown Respiratory Condition Mother. First Degree Relatives Heart Disease Mother. Heart disease in female family member before age 72  Pregnancy / Birth History Destiny Sportsman, MD; 05/10/2019 9:39 AM) Para 1 Maternal age 35-20 Age at menarche 14 years. Contraceptive History Oral contraceptives. Gravida 1  Other Problems Destiny Sportsman, MD; 05/10/2019 9:39 AM) Thyroid Disease Hypercholesterolemia Kidney Stone Migraine Headache Hemorrhoids Anxiety Disorder Bladder Problems Gastroesophageal Reflux Disease    Vitals Destiny Jacobson Destiny Jacobson; 05/10/2019 9:28 AM) 05/10/2019 9:28 AM Weight: 201.6 lb Height: 61in Body Surface Area: 1.9 m Body Mass Index: 38.09 kg/m  Temp.: 99.62F  Pulse: 96 (Regular)  BP: 140/78 (Sitting, Left Arm, Standard)        Physical Exam Destiny Sportsman MD; 05/10/2019 9:41 AM)  General Mental Status-Alert. General Appearance-Not in acute distress, Not Sickly. Orientation-Oriented X3. Hydration-Well hydrated. Voice-Normal.  Integumentary Global Assessment Normal Exam - Axillae: non-tender, no inflammation or ulceration, no drainage. and Distribution of scalp and body hair is normal. General Characteristics Temperature - normal warmth is noted.  Head and Neck Head-normocephalic, atraumatic with no lesions or palpable masses. Face Global Assessment - atraumatic, no absence of expression. Neck Global Assessment - no abnormal movements, no bruit auscultated on the right, no bruit auscultated on the left, no decreased range of motion,  non-tender. Trachea-midline. Thyroid Gland Characteristics - non-tender.  Eye Eyeball - Left-Extraocular movements intact, No Nystagmus - Left. Eyeball - Right-Extraocular movements intact, No Nystagmus - Right. Cornea - Left-No Hazy - Left. Cornea - Right-No Hazy - Right. Sclera/Conjunctiva - Left-No scleral icterus, No Discharge - Left. Sclera/Conjunctiva - Right-No scleral icterus, No Discharge - Right. Pupil - Left-Direct reaction to light normal. Pupil - Right-Direct reaction to light normal.  ENMT Ears Pinna - no drainage observed, no generalized tenderness observed. Pinna - no drainage observed, no generalized tenderness observed. Nose and Sinuses Nose - no destructive lesion observed. Nares - quiet respiration. Nares - quiet respiration. Mouth and Throat Lips - Upper Lip - no fissures observed, no pallor noted. Lower Lip - no fissures observed, no pallor noted. Nasopharynx - no discharge present. Oral Cavity/Oropharynx - Tongue - no dryness observed. Oral Mucosa - no cyanosis observed. Hypopharynx - no evidence of airway distress observed.  Chest and Lung Exam Inspection Movements - Normal and Symmetrical. Accessory muscles - No use of accessory muscles in breathing. Palpation Normal exam - Non-tender. Auscultation Breath sounds - Normal and Clear.  Cardiovascular Auscultation Rhythm - Regular. Murmurs & Other Heart Sounds - Normal exam - No Murmurs and No Systolic Clicks.  Abdomen Inspection Normal Exam - No Visible peristalsis and No Abnormal pulsations. Umbilicus - No Bleeding, No Urine drainage. Palpation/Percussion Normal exam - Soft, Non Tender, No Rebound tenderness, No Rigidity (guarding) and No Cutaneous hyperesthesia. Note: Morbidly obese but nontender. Mild diastases recti. Abdomen soft. Not severely distended. Low midline incision consistent with prior partial hysterectomy and oophorectomy. No umbilical or other anterior abdominal  wall hernias  Female Genitourinary Sexual Maturity Tanner 5 - Adult hair pattern. Note: No vaginal bleeding nor discharge. No major inguinal lymphadenopathy. No inguinal hernias  Rectal Note: Circumferential redundant hemorrhoidal tissue. Right lateral large pile inflamed and chronically prolapsed out. Left lateral at least grade 3. Not is irritated though. No fissure. Normal sphincter tone. No abscess. No major pruritus.   Peripheral Vascular Upper Extremity Inspection - No Cyanotic nailbeds - Left,  Not Ischemic. Inspection - No Cyanotic nailbeds - Right, Not Ischemic.  Neurologic Neurologic evaluation reveals -normal attention span and ability to concentrate, able to name objects and repeat phrases. Appropriate fund of knowledge , normal sensation and normal coordination. Mental Status Affect - not angry, not paranoid. Cranial Nerves-Normal Bilaterally. Gait-Normal.  Neuropsychiatric Mental status exam performed with findings of-able to articulate well with normal speech/language, rate, volume and coherence, thought content normal with ability to perform basic computations and apply abstract reasoning and no evidence of hallucinations, delusions, obsessions or homicidal/suicidal ideation.  Musculoskeletal Global Assessment Spine, Ribs and Pelvis - no instability, subluxation or laxity. Right Upper Extremity - no instability, subluxation or laxity.  Lymphatic Head & Neck General Head & Neck Lymphatics: Bilateral - Description - No Localized lymphadenopathy. Axillary General Axillary Region: Bilateral - Description - No Localized lymphadenopathy. Femoral & Inguinal Generalized Femoral & Inguinal Lymphatics: Left: Right - Description - No Localized lymphadenopathy. Description - No Localized lymphadenopathy.    Assessment & Plan Destiny Sportsman MD; 05/10/2019 9:49 AM)  PROLAPSED INTERNAL HEMORRHOIDS, GRADE 4 (K64.3) Impression: Worsening hemorrhoids with  right posterior chronically prolapsed out. Left lateral prolapses easily. Right anterior almost does as well. Significant external piles. No other etiologies.  Because Destiny Jacobson has worsening bleeding despite decent bowel regimen and prior evaluation with colonoscopy showing no other major etiologies, I think this requires intervention. These are too large and prolapsed for banding to be effective. Banding usually less effective against bleeding hemorrhoids anyway. I think this requires surgery. Hexagonal columnar hemorrhoidal ligation and pexy with hemorrhoidectomy of any remaining tissue. Suspect at least 1 if not 2 piles would require some partial excision as well to get Hern[place] be due better place. I did caution Destiny Jacobson was pain or discomfort with this but it it is what is needed to get her control. Destiny Jacobson suspected as much. Destiny Jacobson has family members and needed this. We will work to coordinate a convenient time   INTERNAL BLEEDING HEMORRHOIDS (K64.8)   PROLAPSED INTERNAL HEMORRHOIDS, GRADE 3 (K64.2)  Current Plans Pt Education - [facility] Hemorrhoids ([place] [place]): discussed with patient and provided information.  ENCOUNTER FOR PREOPERATIVE EXAMINATION FOR GENERAL SURGICAL PROCEDURE (Z01.818)  Current Plans You are being scheduled for surgery- Our schedulers will call you.  You should hear from our office's scheduling department within 5 working days about the location, date, and time of surgery. We try to make accommodations for patient's preferences in scheduling surgery, but sometimes the OR schedule or the surgeon's schedule prevents [place] from making those accommodations.  If you have not heard from our office [phone]) in 5 working days, call the office and ask for your surgeon's nurse.  If you have other questions about your diagnosis, plan, or surgery, call the office and ask for your surgeon's nurse.  The anatomy & physiology of the anorectal region was discussed. The pathophysiology of hemorrhoids  and differential diagnosis was discussed. Natural history risks without surgery was discussed. I stressed the importance of a bowel regimen to have daily soft bowel movements to minimize progression of disease. Interventions such as sclerotherapy & banding were discussed.  The patient's symptoms are not adequately controlled by medicines and other non-operative treatments. I feel the risks & problems of no surgery outweigh the operative risks; therefore, I recommended surgery to treat the hemorrhoids by ligation, pexy, and possible resection.  Risks such as bleeding, infection, urinary difficulties, need for further treatment, heart attack, death, and other risks were discussed. I noted a good likelihood  this will help address the problem. Goals of post-operative recovery were discussed as well. Possibility that this will not correct all symptoms was explained. Post-operative pain, bleeding, constipation, and other problems after surgery were discussed. We will work to minimize complications. Educational handouts further explaining the pathology, treatment options, and bowel regimen were given as well. Questions were answered. The patient expresses understanding & wishes to proceed with surgery.  Pt Education - [facility] Rectal Prep for Anorectal outpatient/office surgery: discussed with patient and provided information. Pt Education - [facility] Rectal Surgery HCI (Lenka Zhao): discussed with patient and provided information.  CHRONIC CONSTIPATION (K59.09)  Current Plans Pt Education - [facility] Constipation (AT) Pt Education - [facility] Good Bowel Health (Wilberth Damon)

## 2019-06-29 ENCOUNTER — Ambulatory Visit
Admission: RE | Admit: 2019-06-29 | Discharge: 2019-06-29 | Disposition: A | Discharge status: Home / Self Care | Payer: BC Managed Care – PPO | Source: Ambulatory Visit | Attending: Physician Assistant | Admitting: Physician Assistant

## 2019-06-29 ENCOUNTER — Other Ambulatory Visit: Payer: Self-pay | Admitting: Physician Assistant

## 2019-06-29 DIAGNOSIS — R059 Cough, unspecified: Secondary | ICD-10-CM

## 2019-06-29 DIAGNOSIS — R05 Cough: Secondary | ICD-10-CM

## 2019-07-11 ENCOUNTER — Other Ambulatory Visit (HOSPITAL_COMMUNITY): Payer: BLUE CROSS/BLUE SHIELD

## 2019-08-05 ENCOUNTER — Encounter (HOSPITAL_BASED_OUTPATIENT_CLINIC_OR_DEPARTMENT_OTHER): Payer: Self-pay | Admitting: Surgery

## 2019-08-08 ENCOUNTER — Encounter (HOSPITAL_BASED_OUTPATIENT_CLINIC_OR_DEPARTMENT_OTHER): Payer: Self-pay | Admitting: Surgery

## 2019-08-08 ENCOUNTER — Other Ambulatory Visit: Payer: Self-pay

## 2019-08-08 ENCOUNTER — Other Ambulatory Visit (HOSPITAL_COMMUNITY)
Admission: RE | Admit: 2019-08-08 | Discharge: 2019-08-08 | Disposition: A | Discharge status: Home / Self Care | Payer: 59 | Source: Ambulatory Visit | Attending: Surgery | Admitting: Surgery

## 2019-08-08 DIAGNOSIS — Z01812 Encounter for preprocedural laboratory examination: Secondary | ICD-10-CM | POA: Insufficient documentation

## 2019-08-08 DIAGNOSIS — Z20822 Contact with and (suspected) exposure to covid-19: Secondary | ICD-10-CM | POA: Diagnosis not present

## 2019-08-08 NOTE — Progress Notes (Signed)
Spoke with:Destiny Jacobson NPO:  No food after midnight/Clear liquids until 12:30 PM DOS Arrival time: 1:30 PM Labs: Istat 8 AM medications: Synthroid Pre op orders: Yes Ride home: Lowry Bowl (friend) [phone]

## 2019-08-09 LAB — NOVEL CORONAVIRUS, NAA (HOSP ORDER, SEND-OUT TO REF LAB; TAT 18-24 HRS): SARS-CoV-2, NAA: NOT DETECTED

## 2019-08-09 NOTE — Progress Notes (Addendum)
Pt surgery  time has change.  Called and spoke w/ pt via phone, she verbalized understanding to arrive at 0900 on 08-11-2019 and be npo after MN , no clear liquids , just sip of water with synthroid.

## 2019-08-11 ENCOUNTER — Ambulatory Visit (HOSPITAL_COMMUNITY)
Admission: RE | Admit: 2019-08-11 | Discharge: 2019-08-11 | Disposition: A | Discharge status: Home / Self Care | Payer: 59 | Attending: Surgery | Admitting: Surgery

## 2019-08-11 ENCOUNTER — Other Ambulatory Visit: Payer: Self-pay

## 2019-08-11 ENCOUNTER — Ambulatory Visit (HOSPITAL_BASED_OUTPATIENT_CLINIC_OR_DEPARTMENT_OTHER): Payer: 59 | Admitting: Anesthesiology

## 2019-08-11 ENCOUNTER — Encounter (HOSPITAL_BASED_OUTPATIENT_CLINIC_OR_DEPARTMENT_OTHER): Payer: Self-pay | Admitting: Surgery

## 2019-08-11 ENCOUNTER — Encounter (HOSPITAL_BASED_OUTPATIENT_CLINIC_OR_DEPARTMENT_OTHER)
Admission: RE | Disposition: A | Discharge status: Home / Self Care | Payer: Self-pay | Source: Home / Self Care | Attending: Surgery

## 2019-08-11 DIAGNOSIS — F329 Major depressive disorder, single episode, unspecified: Secondary | ICD-10-CM | POA: Insufficient documentation

## 2019-08-11 DIAGNOSIS — Z79890 Hormone replacement therapy (postmenopausal): Secondary | ICD-10-CM | POA: Diagnosis not present

## 2019-08-11 DIAGNOSIS — K643 Fourth degree hemorrhoids: Secondary | ICD-10-CM

## 2019-08-11 DIAGNOSIS — E669 Obesity, unspecified: Secondary | ICD-10-CM | POA: Insufficient documentation

## 2019-08-11 DIAGNOSIS — N3281 Overactive bladder: Secondary | ICD-10-CM | POA: Diagnosis present

## 2019-08-11 DIAGNOSIS — K644 Residual hemorrhoidal skin tags: Secondary | ICD-10-CM | POA: Insufficient documentation

## 2019-08-11 DIAGNOSIS — Z87442 Personal history of urinary calculi: Secondary | ICD-10-CM | POA: Diagnosis present

## 2019-08-11 DIAGNOSIS — F419 Anxiety disorder, unspecified: Secondary | ICD-10-CM | POA: Diagnosis not present

## 2019-08-11 DIAGNOSIS — Z79899 Other long term (current) drug therapy: Secondary | ICD-10-CM | POA: Diagnosis not present

## 2019-08-11 DIAGNOSIS — E7800 Pure hypercholesterolemia, unspecified: Secondary | ICD-10-CM | POA: Diagnosis not present

## 2019-08-11 DIAGNOSIS — Z6837 Body mass index (BMI) 37.0-37.9, adult: Secondary | ICD-10-CM | POA: Insufficient documentation

## 2019-08-11 DIAGNOSIS — E039 Hypothyroidism, unspecified: Secondary | ICD-10-CM | POA: Diagnosis present

## 2019-08-11 DIAGNOSIS — F32A Depression, unspecified: Secondary | ICD-10-CM | POA: Diagnosis present

## 2019-08-11 DIAGNOSIS — K219 Gastro-esophageal reflux disease without esophagitis: Secondary | ICD-10-CM | POA: Diagnosis present

## 2019-08-11 DIAGNOSIS — K589 Irritable bowel syndrome without diarrhea: Secondary | ICD-10-CM | POA: Diagnosis present

## 2019-08-11 HISTORY — DX: Hyperlipidemia, unspecified: E78.5

## 2019-08-11 HISTORY — DX: Osteoarthritis of knee, unspecified: M17.9

## 2019-08-11 HISTORY — DX: Personal history of other diseases of the digestive system: Z87.19

## 2019-08-11 HISTORY — DX: Migraine, unspecified, not intractable, without status migrainosus: G43.909

## 2019-08-11 HISTORY — DX: Iron deficiency anemia, unspecified: D50.9

## 2019-08-11 HISTORY — DX: Unspecified hemorrhoids: K64.9

## 2019-08-11 HISTORY — DX: Paresthesia of skin: R20.0

## 2019-08-11 HISTORY — DX: Gastro-esophageal reflux disease without esophagitis: K21.9

## 2019-08-11 HISTORY — DX: Personal history of urinary calculi: Z87.442

## 2019-08-11 HISTORY — DX: Unilateral primary osteoarthritis, unspecified knee: M17.10

## 2019-08-11 HISTORY — DX: Overactive bladder: N32.81

## 2019-08-11 HISTORY — PX: EVALUATION UNDER ANESTHESIA WITH HEMORRHOIDECTOMY: SHX5624

## 2019-08-11 HISTORY — DX: Fatty (change of) liver, not elsewhere classified: K76.0

## 2019-08-11 LAB — POCT I-STAT, CHEM 8
BUN: 11 mg/dL (ref 6–20)
Calcium, Ion: 1.23 mmol/L (ref 1.15–1.40)
Chloride: 106 mmol/L (ref 98–111)
Creatinine, Ser: 0.7 mg/dL (ref 0.44–1.00)
Glucose, Bld: 116 mg/dL — ABNORMAL HIGH (ref 70–99)
HCT: 49 % — ABNORMAL HIGH (ref 36.0–46.0)
Hemoglobin: 16.7 g/dL — ABNORMAL HIGH (ref 12.0–15.0)
Potassium: 4.6 mmol/L (ref 3.5–5.1)
Sodium: 139 mmol/L (ref 135–145)
TCO2: 24 mmol/L (ref 22–32)

## 2019-08-11 SURGERY — EXAM UNDER ANESTHESIA WITH HEMORRHOIDECTOMY
Anesthesia: General | Site: Rectum

## 2019-08-11 MED ORDER — ACETAMINOPHEN 500 MG PO TABS
1000.0000 mg | ORAL_TABLET | ORAL | Status: AC
  Administered 2019-08-11: 1000 mg via ORAL
  Filled 2019-08-11: qty 2

## 2019-08-11 MED ORDER — LACTATED RINGERS IV SOLN
INTRAVENOUS | Status: DC
  Filled 2019-08-11 (×2): qty 1000

## 2019-08-11 MED ORDER — SCOPOLAMINE 1 MG/3DAYS TD PT72
1.0000 | MEDICATED_PATCH | TRANSDERMAL | Status: DC
  Administered 2019-08-11: 09:00:00 1.5 mg via TRANSDERMAL
  Filled 2019-08-11: qty 1

## 2019-08-11 MED ORDER — DIBUCAINE (PERIANAL) 1 % EX OINT
TOPICAL_OINTMENT | CUTANEOUS | Status: DC | PRN
  Administered 2019-08-11: 1 via RECTAL

## 2019-08-11 MED ORDER — BUPIVACAINE-EPINEPHRINE 0.25% -1:200000 IJ SOLN
INTRAMUSCULAR | Status: DC | PRN
  Administered 2019-08-11 (×2): 10 mL

## 2019-08-11 MED ORDER — DEXAMETHASONE SODIUM PHOSPHATE 10 MG/ML IJ SOLN
INTRAMUSCULAR | Status: AC
  Filled 2019-08-11: qty 1

## 2019-08-11 MED ORDER — LIDOCAINE 2% (20 MG/ML) 5 ML SYRINGE
INTRAMUSCULAR | Status: DC | PRN
  Administered 2019-08-11: 60 mg via INTRAVENOUS

## 2019-08-11 MED ORDER — MIDAZOLAM HCL 2 MG/2ML IJ SOLN
INTRAMUSCULAR | Status: DC | PRN
  Administered 2019-08-11: 1 mg via INTRAVENOUS

## 2019-08-11 MED ORDER — GABAPENTIN 300 MG PO CAPS
300.0000 mg | ORAL_CAPSULE | ORAL | Status: AC
  Administered 2019-08-11: 09:00:00 300 mg via ORAL
  Filled 2019-08-11: qty 1

## 2019-08-11 MED ORDER — OXYCODONE HCL 5 MG PO TABS
ORAL_TABLET | ORAL | Status: AC
  Filled 2019-08-11: qty 1

## 2019-08-11 MED ORDER — OXYCODONE HCL 5 MG PO TABS
5.0000 mg | ORAL_TABLET | Freq: Once | ORAL | Status: AC
  Administered 2019-08-11: 5 mg via ORAL
  Filled 2019-08-11: qty 1

## 2019-08-11 MED ORDER — SUGAMMADEX SODIUM 200 MG/2ML IV SOLN
INTRAVENOUS | Status: DC | PRN
  Administered 2019-08-11: 200 mg via INTRAVENOUS

## 2019-08-11 MED ORDER — FENTANYL CITRATE (PF) 100 MCG/2ML IJ SOLN
INTRAMUSCULAR | Status: AC
  Filled 2019-08-11: qty 2

## 2019-08-11 MED ORDER — SCOPOLAMINE 1 MG/3DAYS TD PT72
MEDICATED_PATCH | TRANSDERMAL | Status: AC
  Filled 2019-08-11: qty 1

## 2019-08-11 MED ORDER — GENTAMICIN SULFATE 40 MG/ML IJ SOLN: 5.0000 mg/kg | INTRAVENOUS | Status: DC

## 2019-08-11 MED ORDER — PHENYLEPHRINE HCL (PRESSORS) 10 MG/ML IV SOLN
INTRAVENOUS | Status: DC | PRN
  Administered 2019-08-11: 80 ug via INTRAVENOUS
  Administered 2019-08-11: 40 ug via INTRAVENOUS
  Administered 2019-08-11: 120 ug via INTRAVENOUS

## 2019-08-11 MED ORDER — BUPIVACAINE LIPOSOME 1.3 % IJ SUSP
20.0000 mL | Freq: Once | INTRAMUSCULAR | Status: DC
  Filled 2019-08-11: qty 20

## 2019-08-11 MED ORDER — DEXAMETHASONE SODIUM PHOSPHATE 10 MG/ML IJ SOLN
INTRAMUSCULAR | Status: DC | PRN
  Administered 2019-08-11: 5 mg via INTRAVENOUS

## 2019-08-11 MED ORDER — CHLORHEXIDINE GLUCONATE CLOTH 2 % EX PADS
6.0000 | MEDICATED_PAD | Freq: Once | CUTANEOUS | Status: DC
  Filled 2019-08-11: qty 6

## 2019-08-11 MED ORDER — ROCURONIUM BROMIDE 10 MG/ML (PF) SYRINGE
PREFILLED_SYRINGE | INTRAVENOUS | Status: DC | PRN
  Administered 2019-08-11: 50 mg via INTRAVENOUS

## 2019-08-11 MED ORDER — ONDANSETRON HCL 4 MG/2ML IJ SOLN
INTRAMUSCULAR | Status: AC
  Filled 2019-08-11: qty 2

## 2019-08-11 MED ORDER — LIDOCAINE 2% (20 MG/ML) 5 ML SYRINGE
INTRAMUSCULAR | Status: AC
  Filled 2019-08-11: qty 5

## 2019-08-11 MED ORDER — GENTAMICIN SULFATE 40 MG/ML IJ SOLN
5.0000 mg/kg | Freq: Once | INTRAVENOUS | Status: AC
  Administered 2019-08-11: 320 mg via INTRAVENOUS
  Filled 2019-08-11 (×2): qty 8

## 2019-08-11 MED ORDER — GABAPENTIN 300 MG PO CAPS
ORAL_CAPSULE | ORAL | Status: AC
  Filled 2019-08-11: qty 1

## 2019-08-11 MED ORDER — OXYCODONE HCL 5 MG PO TABS: 5.0000 mg | ORAL_TABLET | Freq: Four times a day (QID) | ORAL | 0 refills | Status: AC | PRN

## 2019-08-11 MED ORDER — MIDAZOLAM HCL 2 MG/2ML IJ SOLN
INTRAMUSCULAR | Status: AC
  Filled 2019-08-11: qty 2

## 2019-08-11 MED ORDER — CLINDAMYCIN PHOSPHATE 900 MG/50ML IV SOLN
900.0000 mg | INTRAVENOUS | Status: AC
  Administered 2019-08-11: 11:00:00 900 mg via INTRAVENOUS
  Filled 2019-08-11: qty 50

## 2019-08-11 MED ORDER — PROPOFOL 10 MG/ML IV BOLUS
INTRAVENOUS | Status: AC
  Filled 2019-08-11: qty 20

## 2019-08-11 MED ORDER — ONDANSETRON HCL 4 MG/2ML IJ SOLN
INTRAMUSCULAR | Status: DC | PRN
  Administered 2019-08-11: 4 mg via INTRAVENOUS

## 2019-08-11 MED ORDER — BUPIVACAINE LIPOSOME 1.3 % IJ SUSP
INTRAMUSCULAR | Status: DC | PRN
  Administered 2019-08-11 (×2): 10 mL

## 2019-08-11 MED ORDER — ROCURONIUM BROMIDE 10 MG/ML (PF) SYRINGE
PREFILLED_SYRINGE | INTRAVENOUS | Status: AC
  Filled 2019-08-11: qty 10

## 2019-08-11 MED ORDER — PHENYLEPHRINE 40 MCG/ML (10ML) SYRINGE FOR IV PUSH (FOR BLOOD PRESSURE SUPPORT)
PREFILLED_SYRINGE | INTRAVENOUS | Status: AC
  Filled 2019-08-11: qty 20

## 2019-08-11 MED ORDER — PROPOFOL 10 MG/ML IV BOLUS
INTRAVENOUS | Status: DC | PRN
  Administered 2019-08-11: 150 mg via INTRAVENOUS

## 2019-08-11 MED ORDER — ACETAMINOPHEN 500 MG PO TABS
ORAL_TABLET | ORAL | Status: AC
  Filled 2019-08-11: qty 2

## 2019-08-11 MED ORDER — FENTANYL CITRATE (PF) 100 MCG/2ML IJ SOLN
INTRAMUSCULAR | Status: DC | PRN
  Administered 2019-08-11 (×2): 50 ug via INTRAVENOUS

## 2019-08-11 MED ORDER — CLINDAMYCIN PHOSPHATE 900 MG/50ML IV SOLN
INTRAVENOUS | Status: AC
  Filled 2019-08-11: qty 50

## 2019-08-11 SURGICAL SUPPLY — 45 items
APL SKNCLS STERI-STRIP NONHPOA (GAUZE/BANDAGES/DRESSINGS) ×1
BENZOIN TINCTURE PRP APPL 2/3 (GAUZE/BANDAGES/DRESSINGS) ×2 IMPLANT
BLADE HEX COATED 2.75 (ELECTRODE) ×2 IMPLANT
BRIEF STRETCH FOR OB PAD LRG (UNDERPADS AND DIAPERS) ×2 IMPLANT
CANISTER SUCTION 1200CC (MISCELLANEOUS) ×2 IMPLANT
COVER BACK TABLE 60X90IN (DRAPES) ×2 IMPLANT
COVER MAYO STAND STRL (DRAPES) ×2 IMPLANT
COVER WAND RF STERILE (DRAPES) ×3 IMPLANT
DECANTER SPIKE VIAL GLASS SM (MISCELLANEOUS) ×2 IMPLANT
DRAPE LAPAROTOMY 100X72 PEDS (DRAPES) ×2 IMPLANT
DRSG PAD ABDOMINAL 8X10 ST (GAUZE/BANDAGES/DRESSINGS) ×2 IMPLANT
ELECT REM PT RETURN 9FT ADLT (ELECTROSURGICAL) ×2
ELECTRODE REM PT RTRN 9FT ADLT (ELECTROSURGICAL) ×1 IMPLANT
FILTER STRAW (MISCELLANEOUS) ×2 IMPLANT
GAUZE SPONGE 4X4 12PLY STRL (GAUZE/BANDAGES/DRESSINGS) ×1 IMPLANT
GLOVE BIO SURGEON STRL SZ 6.5 (GLOVE) ×1 IMPLANT
GLOVE BIOGEL PI IND STRL 6.5 (GLOVE) IMPLANT
GLOVE BIOGEL PI IND STRL 7.0 (GLOVE) IMPLANT
GLOVE BIOGEL PI INDICATOR 6.5 (GLOVE) ×1
GLOVE BIOGEL PI INDICATOR 7.0 (GLOVE) ×1
GLOVE ECLIPSE 8.0 STRL XLNG CF (GLOVE) ×2 IMPLANT
GLOVE INDICATOR 8.0 STRL GRN (GLOVE) ×2 IMPLANT
GOWN STRL REUS W/TWL LRG LVL3 (GOWN DISPOSABLE) ×1 IMPLANT
GOWN STRL REUS W/TWL XL LVL3 (GOWN DISPOSABLE) ×2 IMPLANT
IV CATH PLACEMENT 20 GA (IV SOLUTION) ×2 IMPLANT
KIT TURNOVER CYSTO (KITS) ×2 IMPLANT
NEEDLE HYPO 22GX1.5 SAFETY (NEEDLE) ×2 IMPLANT
NS IRRIG 500ML POUR BTL (IV SOLUTION) ×2 IMPLANT
PACK BASIN DAY SURGERY FS (CUSTOM PROCEDURE TRAY) ×2 IMPLANT
PAD ABD 8X10 STRL (GAUZE/BANDAGES/DRESSINGS) ×1 IMPLANT
PENCIL BUTTON HOLSTER BLD 10FT (ELECTRODE) ×2 IMPLANT
SCRUB TECHNI CARE 4 OZ NO DYE (MISCELLANEOUS) ×2 IMPLANT
SURGILUBE 2OZ TUBE FLIPTOP (MISCELLANEOUS) ×2 IMPLANT
SUT CHROMIC 2 0 SH (SUTURE) ×2 IMPLANT
SUT VIC AB 2-0 UR6 27 (SUTURE) ×6 IMPLANT
SYR 20ML LL LF (SYRINGE) ×2 IMPLANT
SYR 27GX1/2 1ML LL SAFETY (SYRINGE) ×2 IMPLANT
SYR BULB IRRIGATION 50ML (SYRINGE) ×2 IMPLANT
SYR CONTROL 10ML LL (SYRINGE) ×1 IMPLANT
TAPE CLOTH 3X10 TAN LF (GAUZE/BANDAGES/DRESSINGS) ×2 IMPLANT
TOWEL OR 17X26 10 PK STRL BLUE (TOWEL DISPOSABLE) ×2 IMPLANT
TRAY DSU PREP LF (CUSTOM PROCEDURE TRAY) ×2 IMPLANT
TUBE CONNECTING 12X1/4 (SUCTIONS) ×2 IMPLANT
UNDERPAD 30X30 (UNDERPADS AND DIAPERS) ×2 IMPLANT
YANKAUER SUCT BULB TIP NO VENT (SUCTIONS) ×2 IMPLANT

## 2019-08-11 NOTE — Anesthesia Procedure Notes (Signed)
Procedure Name: Intubation Date/Time: 08/11/2019 11:35 AM Performed by: Wanita Chamberlain, CRNA Pre-anesthesia Checklist: Patient being monitored, Suction available, Emergency Drugs available, Patient identified and Timeout performed Patient Re-evaluated:Patient Re-evaluated prior to induction Oxygen Delivery Method: Circle system utilized Preoxygenation: Pre-oxygenation with 100% oxygen Induction Type: IV induction Ventilation: Mask ventilation without difficulty Laryngoscope Size: Mac and 3 Grade View: Grade I Tube type: Oral Tube size: 7.0 mm Number of attempts: 1 Airway Equipment and Method: Stylet Placement Confirmation: breath sounds checked- equal and bilateral,  CO2 detector,  positive ETCO2 and ETT inserted through vocal cords under direct vision Secured at: 21 cm Dental Injury: Teeth and Oropharynx as per pre-operative assessment

## 2019-08-11 NOTE — Anesthesia Preprocedure Evaluation (Signed)
Anesthesia Evaluation  Patient identified by MRN, date of birth, ID band Patient awake    Reviewed: Allergy & Precautions, NPO status , Patient's Chart, lab work & pertinent test results  Airway Mallampati: II  TM Distance: >3 FB Neck ROM: Full    Dental no notable dental hx.    Pulmonary neg pulmonary ROS,    Pulmonary exam normal breath sounds clear to auscultation       Cardiovascular negative cardio ROS Normal cardiovascular exam Rhythm:Regular Rate:Normal     Neuro/Psych  Headaches, PSYCHIATRIC DISORDERS Anxiety Depression    GI/Hepatic GERD  Controlled,(+)     substance abuse  marijuana use, Hemorrhoids, rectal pain, IBS   Endo/Other  Hypothyroidism Obesity BMI 36  Renal/GU negative Renal ROS   Overactive bladder    Musculoskeletal  (+) Arthritis , Osteoarthritis,    Abdominal (+) + obese,   Peds negative pediatric ROS (+)  Hematology  (+) anemia , Iron deficiency anemia   Anesthesia Other Findings   Reproductive/Obstetrics negative OB ROS                             Anesthesia Physical Anesthesia Plan  ASA: II  Anesthesia Plan: General   Post-op Pain Management:    Induction: Intravenous  PONV Risk Score and Plan: 4 or greater and Ondansetron, Dexamethasone, Midazolam, Scopolamine patch - Pre-op and Treatment may vary due to age or medical condition  Airway Management Planned: LMA  Additional Equipment: None  Intra-op Plan:   Post-operative Plan: Extubation in OR  Informed Consent: I have reviewed the patients History and Physical, chart, labs and discussed the procedure including the risks, benefits and alternatives for the proposed anesthesia with the patient or authorized representative who has indicated his/her understanding and acceptance.     Dental advisory given  Plan Discussed with: CRNA  Anesthesia Plan Comments:         Anesthesia Quick  Evaluation

## 2019-08-11 NOTE — Anesthesia Postprocedure Evaluation (Signed)
Anesthesia Post Note  Patient: Destiny Jacobson  Procedure(s) Performed: ANORECTAL EXAM UNDER ANESTHESIA, HEMORRHOIDAL LIGATION AND HEMORRHOIDECTOMY (N/A Rectum)     Patient location during evaluation: PACU Anesthesia Type: General Level of consciousness: awake and alert, oriented and patient cooperative Pain management: pain level controlled Vital Signs Assessment: post-procedure vital signs reviewed and stable Respiratory status: spontaneous breathing, nonlabored ventilation and respiratory function stable Cardiovascular status: blood pressure returned to baseline and stable Postop Assessment: no apparent nausea or vomiting Anesthetic complications: no    Last Vitals:  Vitals:   08/11/19 1315 08/11/19 1330  BP: 130/78   Pulse: 97 90  Resp: (!) 22 18  Temp:    SpO2: 91% 93%    Last Pain:  Vitals:   08/11/19 1309  TempSrc:   PainSc: 0-No pain                 Lannie Fields

## 2019-08-11 NOTE — Discharge Instructions (Signed)
ANORECTAL SURGERY:  POST OPERATIVE INSTRUCTIONS  ######################################################################  EAT Start with a pureed / full liquid diet After 24 hours, gradually transition to a high fiber diet.    CONTROL PAIN Control pain so you can tolerate bowel movements,  walk, sleep, tolerate sneezing/coughing, and go up/down stairs.   HAVE A BOWEL MOVEMENT DAILY Keep your bowels regular to avoid problems.   Taking a fiber supplement every day to keep bowels soft.   Try a laxative to override constipation. Use an antidairrheal to slow down diarrhea.   Call if not better after 2 tries  WALK Walk an hour a day.  Control your pain to do that.   CALL IF YOU HAVE PROBLEMS/CONCERNS Call if you are still struggling despite following these instructions. Call if you have concerns not answered by these instructions  ######################################################################    1. Take your usually prescribed home medications unless otherwise directed.  2. DIET: Follow a light bland diet & liquids the first 24 hours after arrival home, such as soup, liquids, starches, etc.  Be sure to drink plenty of fluids.  Quickly advance to a usual solid diet within a few days.  Avoid fast food or heavy meals as your are more likely to get nauseated or have irregular bowels.  A low-fat, high-fiber diet for the rest of your life is ideal.  3. PAIN CONTROL: a. Pain is best controlled by a usual combination of three different methods TOGETHER: i. Ice/Heat ii. Over the counter pain medication iii. Prescription pain medication b. Expect swelling and discomfort in the anus/rectal area.  Warm water baths (30-60 minutes up to 6 times a day, especially after bowel meovements) will help. Use ice for the first few days to help decrease swelling and bruising, then switch to heat such as warm towels, sitz baths, warm baths, etc to help relax tight/sore spots and speed recovery.   Some people prefer to use ice alone, heat alone, alternating between ice & heat.  Experiment to what works for you.   c. It is helpful to take an over-the-counter pain medication continuously for the first few weeks.  Choose one of the following that works best for you: i. Naproxen (Aleve, etc)  Two 227m tabs twice a day ii. Ibuprofen (Advil, etc) Three 2049mtabs four times a day (every meal & bedtime) iii. Acetaminophen (Tylenol, etc) 500-650102mour times a day (every meal & bedtime) d. A  prescription for pain medication (such as oxycodone, hydrocodone, etc) should be given to you upon discharge.  Take your pain medication as prescribed.  i. If you are having problems/concerns with the prescription medicine (does not control pain, nausea, vomiting, rash, itching, etc), please call us [phone] see if we need to switch you to a different pain medicine that will work better for you and/or control your side effect better. ii. If you need a refill on your pain medication, please contact your pharmacy.  They will contact our office to request authorization. Prescriptions will not be filled after 5 pm or on week-ends.  If can take up to 48 hours for it to be filled & ready so avoid waiting until you are down to thel ast pill. e. A topical cream (Dibucaine) or a prescription for a cream (such as diltiazem 2% gel) may be given to you.  Many people find relief with topical creams.  Some people find it burns too much.  Experiment.  If it helps, use it.  If it burns, stop using  it.  Use a Sitz Bath 4-8 times a day for relief   [employer] A sitz bath is a warm water bath taken in the sitting position that covers only the hips and buttocks. It may be used for either healing or hygiene purposes. Sitz baths are also used to relieve pain, itching, or muscle spasms. The water may contain medicine. Moist heat will help you heal and relax.  HOME CARE INSTRUCTIONS  Take 3 to 4 sitz baths a day. 1. Fill the  bathtub half full with warm water. 2. Sit in the water and open the drain a little. 3. Turn on the warm water to keep the tub half full. Keep the water running constantly. 4. Soak in the water for 15 to 20 minutes. 5. After the sitz bath, pat the affected area dry first.   4. KEEP YOUR BOWELS REGULAR a. The goal is one soft bowel movement a day b. Avoid getting constipated.  Between the surgery and the pain medications, it is common to experience some constipation.  Increasing fluid intake and taking a fiber supplement (such as Metamucil, Citrucel, FiberCon, MiraLax, etc) 2-3 times a day regularly will usually help prevent this problem from occurring.  A mild laxative (prune juice, Milk of Magnesia, MiraLax, etc) should be taken according to package directions if there are no bowel movements after 48 hours. c. Watch out for diarrhea.  If you have many loose bowel movements, simplify your diet to bland foods & liquids for a few days.  Stop any stool softeners and decrease your fiber supplement.  Switching to mild anti-diarrheal medications (Kayopectate, Pepto Bismol) can help.  Can try an imodium/loperamide dose.  If this worsens or does not improve, please call us.  5. Wound Care  a. Remove your bandages with your first bowel movement, usually the day after surgery.  You may have packing if you had an abscess.  Let any packing or gauze fall come out.   b. Wear an absorbent pad or soft cotton balls in your underwear as needed to catch any drainage and help keep the area  c. Keep the area clean and dry.  Bathe / shower every day.  Keep the area clean by showering / bathing over the incision / wound.   It is okay to soak an open wound to help wash it.  Consider using a squeeze bottle filled with warm water to gently wash the anal area.  Wet wipes or showers / gentle washing after bowel movements is often less traumatic than regular toilet paper. d. Dennis Bast will often notice bleeding with bowel movements.   This should slow down by the end of the first week of surgery.  Sitting on an ice pack can help. e. Expect some drainage.  This should slow down by the end of the first week of surgery, but you will have occasional bleeding or drainage up to a few months after surgery.  Wear an absorbent pad or soft cotton gauze in your underwear until the drainage stops.  6. ACTIVITIES as tolerated:   a. You may resume regular (light) daily activities beginning the next day--such as daily self-care, walking, climbing stairs--gradually increasing activities as tolerated.  If you can walk 30 minutes without difficulty, it is safe to try more intense activity such as jogging, treadmill, bicycling, low-impact aerobics, swimming, etc. b. Save the most intensive and strenuous activity for last such as sit-ups, heavy lifting, contact sports, etc  Refrain from any heavy lifting or straining  until you are off narcotics for pain control.   c. DO NOT PUSH THROUGH PAIN.  Let pain be your guide: If it hurts to do something, don't do it.  Pain is your body warning you to avoid that activity for another week until the pain goes down. d. You may drive when you are no longer taking prescription pain medication, you can comfortably sit for long periods of time, and you can safely maneuver your car and apply brakes. e. Dennis Bast may have sexual intercourse when it is comfortable.  7. FOLLOW UP in our office a. Please call [facility] at [phone] to set up an appointment to see your surgeon in the office for a follow-up appointment approximately 2-3 weeks after your surgery. b. Make sure that you call for this appointment the day you arrive home to ensure a convenient appointment time.  8. IF YOU HAVE DISABILITY OR FAMILY LEAVE FORMS, BRING THEM TO THE OFFICE FOR PROCESSING.  DO NOT GIVE THEM TO YOUR DOCTOR.   WHEN TO CALL us [phone]: 1. Poor pain control 2. Reactions / problems with new medications (rash/itching, nausea, etc)   3. Fever over 101.5 F (38.5 C) 4. Inability to urinate 5. Nausea and/or vomiting 6. Worsening swelling or bruising 7. Continued bleeding from incision. 8. Increased pain, redness, or drainage from the incision  The clinic staff is available to answer your questions during regular business hours (8:30am-5pm).  Please don't hesitate to call and ask to speak to one of our nurses for clinical concerns.   A surgeon from [facility] is always on call at the hospitals   If you have a medical emergency, go to the nearest emergency room or call 911.    [facility], [place], [place], [place], [place]  [address] ? MAIN: [phone] ? TOLL FREE: [phone] ? FAX [phone] [email or web address]   HEMORRHOIDS   Hemorrhoidal piles are natural clusters of blood vessels that help the rectum and anal canal stretch to hold stool and allow bowel movements.  Most people will develop a flare of hemorrhoids in their lifetime.  When hemorrhoidals are irritated, they can swell, burn, itch, cause pain, and bleed.  Most flares will calm down gradually within a few weeks.  However, once hemorrhoids are created, they tend to flare more easily.  Fortunately, good habits and simple medical treatment usually control hemorrhoids well, and surgery is needed only in severe cases.  TREATMENT OF HEMORRHOID FLARE Warm soaks. 4-8 times a day This helps more than any topical medication.   1. A sitz bath is a warm water bath taken in the sitting position that covers only the hips and buttocks.Fill the bathtub half full with warm water. 2. Soak in the water for 15 to 30 minutes. 3. After the sitz bath, pat the affected area dry first.  Normalize your bowels.  Extremes of diarrhea or constipation will make hemorrhoids worse.  One soft bowel movement a day is the goal.   Wet wipes instead of toilet paper Pain control with a NSAID such as ibuprofen (Advil) or  naproxen (Aleve) or acetaminophen (Tylenol) around the clock.  Narcotics are constipating and should be minimized if possible Topical creams contain steroids (bydrocortisone) or local anesthetic (xylocaine) can help make pain and itching more tolerable.    TROUBLESHOOTING IRREGULAR BOWELS 1) Avoid extremes of bowel movements (no bad constipation/diarrhea) 2) Miralax 17gm in 8oz. water or juice every day. May use twice a day.  3) Gas-x or Phazyme as needed for gas & bloating.  4) Soft & bland diet. No spicy, greasy, or fried foods.  5) Omeprazole over-the-counter as needed  6) May hold gluten/wheat products from diet to see if symptoms improve.  7)  May try probiotics (Align, Activa, etc) to help calm the bowels down 7) If symptoms become worse: Call back immediately.    Post Anesthesia Home Care Instructions  Activity: Get plenty of rest for the remainder of the day. A responsible adult should stay with you for 24 hours following the procedure.  For the next 24 hours, DO NOT: -Drive a car -Advertising copywriter -Drink alcoholic beverages -Take any medication unless instructed by your physician -Make any legal decisions or sign important papers.  Meals: Start with liquid foods such as gelatin or soup. Progress to regular foods as tolerated. Avoid greasy, spicy, heavy foods. If nausea and/or vomiting occur, drink only clear liquids until the nausea and/or vomiting subsides. Call your physician if vomiting continues.  Special Instructions/Symptoms: Your throat may feel dry or sore from the anesthesia or the breathing tube placed in your throat during surgery. If this causes discomfort, gargle with warm salt water. The discomfort should disappear within 24 hours.  If you had a scopolamine patch placed behind your ear for the management of post- operative nausea and/or vomiting:  1. The medication in the patch is effective for 72 hours, after which it should be removed.  Wrap patch in a  tissue and discard in the trash. Wash hands thoroughly with soap and water. 2. You may remove the patch earlier than 72 hours if you experience unpleasant side effects which may include dry mouth, dizziness or visual disturbances. 3. Avoid touching the patch. Wash your hands with soap and water after contact with the patch.   Information for Discharge Teaching: EXPAREL (bupivacaine liposome injectable suspension)   Your surgeon gave you EXPAREL(bupivacaine) in your surgical incision to help control your pain after surgery.   EXPAREL is a local anesthetic that provides pain relief by numbing the tissue around the surgical site.  EXPAREL is designed to release pain medication over time and can control pain for up to 72 hours.  Depending on how you respond to EXPAREL, you may require less pain medication during your recovery.  Possible side effects:  Temporary loss of sensation or ability to move in the area where bupivacaine was injected.  Nausea, vomiting, constipation  Rarely, numbness and tingling in your mouth or lips, lightheadedness, or anxiety may occur.  Call your doctor right away if you think you may be experiencing any of these sensations, or if you have other questions regarding possible side effects.  Follow all other discharge instructions given to you by your surgeon or nurse. Eat a healthy diet and drink plenty of water or other fluids.  If you return to the hospital for any reason within 96 hours following the administration of EXPAREL, please inform your health care providers.

## 2019-08-11 NOTE — Interval H&P Note (Signed)
History and Physical Interval Note:  08/11/2019 10:58 AM  Destiny Jacobson  has presented today for surgery, with the diagnosis of HEMORRHOIDS PROLAPSED GRADE 4 WITH BLEEDING AND WITH PAIN.  The various methods of treatment have been discussed with the patient and family. After consideration of risks, benefits and other options for treatment, the patient has consented to  Procedure(s): ANORECTAL EXAM UNDER ANESTHESIA, HEMORRHOIDAL LIGATION AND HEMORRHOIDECTOMY (N/A) as a surgical intervention.  The patient's history has been reviewed, patient examined, no change in status, stable for surgery.  I have reviewed the patient's chart and labs.  Questions were answered to the patient's satisfaction.    I have re-reviewed the the patient's records, history, medications, and allergies.  I have re-examined the patient.  I again discussed intraoperative plans and goals of post-operative recovery.  The patient agrees to proceed.  Destiny Jacobson  03-11-1961 [id number]  Patient Care Team: Milus Height, PA-C as PCP - General (Nurse Practitioner) Karie Soda, MD as Consulting Physician (General Surgery) Willis Modena, MD as Consulting Physician (Gastroenterology)  Patient Active Problem List   Diagnosis Date Noted   Hyperlipidemia 05/20/2016    Past Medical History:  Diagnosis Date   Anxiety and depression    Fatty liver    GERD (gastroesophageal reflux disease)    occ takens over the counter TUMS   Hemorrhoids    grade 3/4 w/ bleeding   History of inguinal hernia    Bilateral   History of kidney stones    Hyperlipidemia    Hypothyroidism    IBS (irritable bowel syndrome)    Iron deficiency anemia    Migraines    remote history   Numbness and tingling in both hands    OA (osteoarthritis) of knee    right greater than left   Overactive bladder    Rectal bleeding     Past Surgical History:  Procedure Laterality Date   COLONOSCOPY     KIDNEY STONE SURGERY  2013 OR 2014    SUPRACERVICAL ABDOMINAL HYSTERECTOMY  11/2005   TOTAL ABDOMINAL HYSTERECTOMY  11/2004   [place]   TUBAL LIGATION      Social History   Socioeconomic History   Marital status: Divorced    Spouse name: Not on file   Number of children: 1   Years of education: Not on file   Highest education level: Not on file  Occupational History   Occupation: PTI   Tobacco Use   Smoking status: Never Smoker   Smokeless tobacco: Never Used  Substance and Sexual Activity   Alcohol use: Yes    Comment: mixed drink socially   Drug use: Yes    Types: Marijuana    Comment: none in last 16-17 years   Sexual activity: Not on file  Other Topics Concern   Not on file  Social History Narrative   Not on file   Social Determinants of Health   Financial Resource Strain:    Difficulty of Paying Living Expenses: Not on file  Food Insecurity:    Worried About Programme researcher, broadcasting/film/video in the Last Year: Not on file   [employer] of Food in the Last Year: Not on file  Transportation Needs:    Lack of Transportation (Medical): Not on file   Lack of Transportation (Non-Medical): Not on file  Physical Activity:    Days of Exercise per Week: Not on file   Minutes of Exercise per Session: Not on file  Stress:    Feeling  of Stress : Not on file  Social Connections:    Frequency of Communication with Friends and Family: Not on file   Frequency of Social Gatherings with Friends and Family: Not on file   Attends Religious Services: Not on file   Active Member of Clubs or Organizations: Not on file   Attends Archivist Meetings: Not on file   Marital Status: Not on file  Intimate Partner Violence:    Fear of Current or Ex-Partner: Not on file   Emotionally Abused: Not on file   Physically Abused: Not on file   Sexually Abused: Not on file    Family History  Problem Relation Age of Onset   Heart attack Mother 57   CAD Mother    Hyperlipidemia Sister     Medications Prior to Admission   Medication Sig Dispense Refill Last Dose   Ascorbic Acid (VITAMIN C) 1000 MG tablet Take 1,000 mg by mouth daily.   Past Week at Unknown time   calcium carbonate (TUMS - DOSED IN MG ELEMENTAL CALCIUM) 500 MG chewable tablet Chew 1 tablet by mouth as needed for indigestion or heartburn.   Past Week at Unknown time   Evolocumab (REPATHA [place]) Inject into the skin every 14 (fourteen) days.   Past Month at Unknown time   Ferrous Gluconate (IRON 27 PO) Take by mouth.   Past Week at Unknown time   hydrocortisone (ANUSOL-HC) 25 MG suppository Place 25 mg rectally 2 (two) times daily as needed for hemorrhoids or itching.   Past Month at Unknown time   levothyroxine (SYNTHROID, LEVOTHROID) 175 MCG tablet Take 175 mcg by mouth daily before breakfast.   08/11/2019 at 0700   Multiple Vitamin (MULTIVITAMIN) tablet Take 1 tablet by mouth daily.   Past Week at Unknown time   zolpidem (AMBIEN) 10 MG tablet Take 10 mg by mouth at bedtime as needed for sleep.    Past Week at Unknown time   FIBER ADULT GUMMIES PO Take by mouth.   More than a month at Unknown time   Flaxseed, Linseed, (FLAX SEED OIL) 1000 MG CAPS Take 1,000 mg by mouth 3 (three) times daily.      FLUoxetine (PROZAC) 20 MG capsule Take 20 mg by mouth 3 (three) times daily.   More than a month at Unknown time   rosuvastatin (CRESTOR) 10 MG tablet Take 1 tablet (10 mg total) by mouth daily. (Patient not taking: Reported on 08/08/2019) 30 tablet 11 Not Taking at Unknown time   Wheat Dextrin (BENEFIBER) POWD Take by mouth as directed.   More than a month at Unknown time    Current Facility-Administered Medications  Medication Dose Route Frequency Provider Last Rate Last Admin   bupivacaine liposome (EXPAREL) 1.3 % injection 266 mg  20 mL Infiltration Once Michael Boston, MD       Chlorhexidine Gluconate Cloth 2 % PADS 6 each  6 each Topical Once Michael Boston, MD       And   Chlorhexidine Gluconate Cloth 2 % PADS 6 each  6 each Topical Once Michael Boston,  MD       clindamycin (CLEOCIN) IVPB 900 mg  900 mg Intravenous On Call to OR Michael Boston, MD       gentamicin (GARAMYCIN) 320 mg in dextrose 5 % 100 mL IVPB  5 mg/kg (Adjusted) Intravenous Once Lenis Noon, RPH       lactated ringers infusion   Intravenous Continuous Josephine Igo, MD 50 mL/hr at  08/11/19 0935 New Bag at 08/11/19 0935   scopolamine (TRANSDERM-SCOP) 1 MG/3DAYS 1.5 mg  1 patch Transdermal Q72H Lannie Fields, DO   1.5 mg at 08/11/19 3546     Allergies  Allergen Reactions   Amoxicillin Diarrhea and Nausea And Vomiting   Valacyclovir Hcl Other (See Comments)    ABDOMINAL PAIN     BP (!) 107/91   Pulse (!) 105   Temp 98.8 F (37.1 C) (Oral)   Resp 18   Ht 5\' 1"  (1.549 m)   Wt 90.4 kg   SpO2 97%   BMI 37.68 kg/m   Labs: Results for orders placed or performed during the hospital encounter of 08/11/19 (from the past 48 hour(s))  I-STAT, chem 8     Status: Abnormal   Collection Time: 08/11/19  9:30 AM  Result Value Ref Range   Sodium 139 135 - 145 mmol/L   Potassium 4.6 3.5 - 5.1 mmol/L   Chloride 106 98 - 111 mmol/L   BUN 11 6 - 20 mg/dL   Creatinine, Ser 08/13/19 0.44 - 1.00 mg/dL   Glucose, Bld 5.68 (H) 70 - 99 mg/dL   Calcium, Ion 127 5.17 - 1.40 mmol/L   TCO2 24 22 - 32 mmol/L   Hemoglobin 16.7 (H) 12.0 - 15.0 g/dL   HCT 0.01 (H) 74.9 - 44.9 %    Imaging / Studies: No results found.   67.5, M.D., F.A.C.S. Gastrointestinal and Minimally Invasive Surgery [facility] [address] [place], [place] [phone] Main / Paging  08/11/2019 10:59 AM    08/13/2019

## 2019-08-11 NOTE — H&P (Signed)
08/11/2019  Destiny Jacobson  DOB: 02/08/1961   `  `  Patient sent for surgical consultation at the request of Milus Height, [place]   Chief Complaint: Persistent hemorrhoids  `  `  Patient returns almost a year from the last visit. I had seen her last year for hemorrhoid problems with bleeding. She was prolapsed out. I felt these were too far gone to try and control just banding only. She tried  To hold off on surgery, but they have persistently been a problem. She is still somewhat constipated, moving her bowels about 2 times a week. However BMs softer and She Is Using Another Type of Fiber Supplement. She Does Confess She's Not As Compliant As She Would like to Be. Still Occasionally Needs Tramadol for Chronic Pain Issues. No Major Bleeding. Because Hemorrhoids Have Been Persistent Problem with Irritation and Bleeding and Prolapsing, She Comes Back into Reconsider My Advice   Ready for surgery  PRIOR NOTE:  The patient is a pleasant woman history it was hemorrhoids for many decades per essentially since she had her child. Usually has some intermittent bleeding but mostly manageable. Followed by Dr. Dulce Sellar with gastroenterology. Colonoscopy about 5 years ago was underwhelming. Moves her bowels about every other day. And intermittent bleeding hemorrhoids. Surgical consultation requested. Absorbable my partner, Dr. Andrey Campanile, 2 years ago. External hemorrhoids noted. Fiber bowel regimen recommended in holding off on surgical intervention. Patient notes that she was using pads and topical agents to try and manage it. However to progressively worsen. Feels like there is something out all the time. Wearing diapers. Bleeding. Has had severe bleeding episodes at work. Frustrating and embarrassing. Some discomfort with bowel movements and stinging. Based on concerns, surgical consultation requested.  No personal nor family history of GI/colon cancer, inflammatory bowel disease, irritable bowel syndrome,  allergy such as Celiac Sprue, dietary/dairy problems, colitis, ulcers nor gastritis. No recent sick contacts/gastroenteritis. No travel outside the country. No changes in diet. No dysphagia to solids or liquids. No significant heartburn or reflux. No melena, hematemesis, coffee ground emesis. No evidence of prior gastric/peptic ulceration.  (Review of systems as stated in this history (HPI) or in the review of systems. Otherwise all other 12 point ROS are negative)  `  `  `  Problem List/Past Medical [name], MD; 05/10/2019 9:39 AM)  INFLAMED EXTERNAL HEMORRHOID (K64.4)  PROLAPSED INTERNAL HEMORRHOIDS, GRADE 4 (K64.3)  INTERNAL BLEEDING HEMORRHOIDS (K64.8)  PROLAPSED INTERNAL HEMORRHOIDS, GRADE 3 (K64.2)  ENCOUNTER FOR PREOPERATIVE EXAMINATION FOR GENERAL SURGICAL PROCEDURE (Z61.096)  Past Surgical History [name], MD; 05/10/2019 9:39 AM)  Hysterectomy (not due to cancer) - Partial  Diagnostic Studies History [name], MD; 05/10/2019 9:39 AM)  Colonoscopy within last year  Mammogram 1-3 years ago  Pap Smear 1-5 years ago  Allergies Schuyler Amor, CMA; 05/10/2019 9:24 AM)  Allergies Reconciled  Medication History Schuyler Amor, CMA; 05/10/2019 9:28 AM)  FLUoxetine HCl (20MG  Capsule, Oral) Active.  Levothyroxine Sodium ( Tablet, Oral) Active.  Multiple Vitamins/Iron (1 (one) Oral) Active.  PROzac (20MG  Capsule, Oral) Active.  traMADol HCl (50MG  Tablet, Oral) Active.  Zofran (4MG  Tablet, Oral) Active.  Rosuvastatin Calcium (5MG  Tablet, Oral) Active.  Medications Reconciled  Social History , MD; 05/10/2019 9:39 AM)  Alcohol use Moderate alcohol use.  Caffeine use Carbonated beverages, Tea.  Illicit drug use Remotely quit drug use.  Tobacco use Never smoker.  Family History , MD; 05/10/2019 9:39 AM)  Alcohol Abuse Mother.  Cervical Cancer Mother.  Depression Brother, Father, Mother.  Family history unknown  Respiratory  Condition Mother.  First Degree Relatives  Heart Disease Mother.  Heart disease in female family member before age 17  Pregnancy / Birth History [name], MD; 05/10/2019 9:39 AM)  Para 1  Maternal age 103-20  Age at menarche 14 years.  Contraceptive History Oral contraceptives.  Gravida 1  Other Problems [name], MD; 05/10/2019 9:39 AM)  Thyroid Disease  Hypercholesterolemia  Kidney Stone  Migraine Headache  Hemorrhoids  Anxiety Disorder  Bladder Problems  Gastroesophageal Reflux Disease   Vitals Schuyler Amor CMA; 05/10/2019 9:28 AM)  05/10/2019 9:28 AM  Weight: 201.6 lb Height: 61 in  Body Surface Area: 1.9 m Body Mass Index: 38.09 kg/m  Temp.: 99.7 F Pulse: 96 (Regular)  BP: 140/78 (Sitting, Left Arm, Standard)    BP (!) 107/91   Pulse (!) 105   Temp 98.8 F (37.1 C) (Oral)   Resp 18   Ht 5\' 1"  (1.549 m)   Wt 90.4 kg   SpO2 97%   BMI 37.68 kg/m  08/11/2019   Physical Exam General  Mental Status - Alert.  General Appearance - Not in acute distress, Not Sickly.  Orientation - Oriented X3.  Hydration - Well hydrated.  Voice - Normal.  Integumentary  Global Assessment  Normal Exam - Axillae: non-tender, no inflammation or ulceration, no drainage. and Distribution of scalp and body hair is normal.  General Characteristics  Temperature - normal warmth is noted.  Head and Neck  Head - normocephalic, atraumatic with no lesions or palpable masses.  Face  Global Assessment - atraumatic, no absence of expression.  Neck  Global Assessment - no abnormal movements, no bruit auscultated on the right, no bruit auscultated on the left, no decreased range of motion, non-tender.  Trachea - midline.  Thyroid  Gland Characteristics - non-tender.  Eye  Eyeball - Left - Extraocular movements intact, No Nystagmus - Left.  Eyeball - Right - Extraocular movements intact, No Nystagmus - Right.  Cornea - Left - No Hazy - Left.  Cornea - Right - No Hazy -  Right.  Sclera/Conjunctiva - Left - No scleral icterus, No Discharge - Left.  Sclera/Conjunctiva - Right - No scleral icterus, No Discharge - Right.  Pupil - Left - Direct reaction to light normal.  Pupil - Right - Direct reaction to light normal.  ENMT  Ears  Pinna - no drainage observed, no generalized tenderness observed. Pinna - no drainage observed, no generalized tenderness observed.  Nose and Sinuses  Nose - no destructive lesion observed. Nares - quiet respiration. Nares - quiet respiration.  Mouth and Throat  Lips - Upper Lip - no fissures observed, no pallor noted. Lower Lip - no fissures observed, no pallor noted. Nasopharynx - no discharge present. Oral Cavity/Oropharynx - Tongue - no dryness observed. Oral Mucosa - no cyanosis observed. Hypopharynx - no evidence of airway distress observed.  Chest and Lung Exam  Inspection  Movements - Normal and Symmetrical. Accessory muscles - No use of accessory muscles in breathing.  Palpation  Normal exam - Non-tender.  Auscultation  Breath sounds - Normal and Clear.  Cardiovascular  Auscultation  Rhythm - Regular. Murmurs & Other Heart Sounds - Normal exam - No Murmurs and No Systolic Clicks.  Abdomen  Inspection  Normal Exam - No Visible peristalsis and No Abnormal pulsations. Umbilicus - No Bleeding, No Urine drainage.  Palpation/Percussion  Normal exam - Soft,  Non Tender, No Rebound tenderness, No Rigidity (guarding) and No Cutaneous hyperesthesia.  Note: Morbidly obese but nontender. Mild diastases recti. Abdomen soft. Not severely distended. Low midline incision consistent with prior partial hysterectomy and oophorectomy. No umbilical or other anterior abdominal wall hernias  Female Genitourinary  Sexual Maturity  Tanner 5 - Adult hair pattern.  Note: No vaginal bleeding nor discharge. No major inguinal lymphadenopathy. No inguinal hernias  Rectal  Note: Circumferential redundant hemorrhoidal tissue. Right lateral large pile  inflamed and chronically prolapsed out. Left lateral at least grade 3. Not is irritated though.  No fissure. Normal sphincter tone. No abscess. No major pruritus.  Peripheral Vascular  Upper Extremity  Inspection - No Cyanotic nailbeds - Left, Not Ischemic. Inspection - No Cyanotic nailbeds - Right, Not Ischemic.  Neurologic  Neurologic evaluation reveals - normal attention Jacobson and ability to concentrate, able to name objects and repeat phrases. Appropriate fund of knowledge , normal sensation and normal coordination.  Mental Status  Affect - not angry, not paranoid.  Cranial Nerves - Normal Bilaterally.  Gait - Normal.  Neuropsychiatric  Mental status exam performed with findings of - able to articulate well with normal speech/language, rate, volume and coherence, thought content normal with ability to perform basic computations and apply abstract reasoning and no evidence of hallucinations, delusions, obsessions or homicidal/suicidal ideation.  Musculoskeletal  Global Assessment  Spine, Ribs and Pelvis - no instability, subluxation or laxity. Right Upper Extremity - no instability, subluxation or laxity.  Lymphatic  Head & Neck  General Head & Neck Lymphatics: Bilateral - Description - No Localized lymphadenopathy.  Axillary  General Axillary Region: Bilateral - Description - No Localized lymphadenopathy.  Femoral & Inguinal  Generalized Femoral & Inguinal Lymphatics:  Left: Right - Description - No Localized lymphadenopathy. Description - No Localized lymphadenopathy.    Assessment & Plan    PROLAPSED INTERNAL HEMORRHOIDS, GRADE 4 (K64.3)  Impression: Worsening hemorrhoids with right posterior chronically prolapsed out. Left lateral prolapses easily. Right anterior almost does as well. Significant external piles. No other etiologies.   Because she has worsening bleeding despite decent bowel regimen and prior evaluation with colonoscopy showing no other major etiologies, I think  this requires intervention. These are too large and prolapsed for banding to be effective. Banding usually less effective against bleeding hemorrhoids anyway. I think this requires surgery. Hexagonal columnar hemorrhoidal ligation and pexy with hemorrhoidectomy of any remaining tissue. Suspect at least 1 if not 2 piles would require some partial excision as well to get Hernando be due better place. I did caution she will have early postop pain with this, but it it is what is needed to get her control. She suspected as much. She has family members who have needed this. We will work to coordinate a convenient time   The anatomy & physiology of the anorectal region was discussed. The pathophysiology of hemorrhoids and differential diagnosis was discussed. Natural history risks without surgery was discussed. I stressed the importance of a bowel regimen to have daily soft bowel movements to minimize progression of disease. Interventions such as sclerotherapy & banding were discussed.  The patient's symptoms are not adequately controlled by medicines and other non-operative treatments. I feel the risks & problems of no surgery outweigh the operative risks; therefore, I recommended surgery to treat the hemorrhoids by ligation, pexy, and possible resection.  Risks such as bleeding, infection, urinary difficulties, need for further treatment, heart attack, death, and other risks were discussed. I noted a good  likelihood this will help address the problem. Goals of post-operative recovery were discussed as well. Possibility that this will not correct all symptoms was explained. Post-operative pain, bleeding, constipation, and other problems after surgery were discussed. We will work to minimize complications. Educational handouts further explaining the pathology, treatment options, and bowel regimen were given as well. Questions were answered. The patient expresses understanding & wishes to proceed with surgery.

## 2019-08-11 NOTE — Op Note (Signed)
08/11/2019  12:55 PM  PATIENT:  Destiny Jacobson  59 y.o. female  Patient Care Team: Milus Height, PA-C as PCP - General (Nurse Practitioner) Karie Soda, MD as Consulting Physician (General Surgery) Willis Modena, MD as Consulting Physician (Gastroenterology)  PRE-OPERATIVE DIAGNOSIS:  HEMORRHOIDS PROLAPSED GRADE 4 WITH BLEEDING AND WITH PAIN  POST-OPERATIVE DIAGNOSIS:  HEMORRHOIDS PROLAPSED GRADE 4 WITH BLEEDING AND WITH PAIN  PROCEDURE:  Procedure(s): ANORECTAL EXAM UNDER ANESTHESIA, HEMORRHOIDAL LIGATION AND HEMORRHOIDECTOMY  Internal and external hemorrhoidectomy  x3 Internal hemorrhoidal ligation and pexy Anorectal examination under anesthesia  SURGEON:  [name], MD  ANESTHESIA:   General Anorectal & Local field block (0.25% bupivacaine with epinephrine mixed with Liposomal bupivacaine (Experel)   EBL:  Total I/O In: 150 [IV Piggyback:150] Out: - .  See operative record  Delay start of Pharmacological VTE agent (>24hrs) due to surgical blood loss or risk of bleeding:  NO  DRAINS: NONE  SPECIMEN:   Internal & external hemorrhoid x3  DISPOSITION OF SPECIMEN:  PATHOLOGY  COUNTS:  YES  PLAN OF CARE: Discharge home after PACU  PATIENT DISPOSITION:  PACU - hemodynamically stable.  INDICATION: Pleasant patient with struggles with hemorrhoids.  Not able to be managed in the office despite an improved bowel regimen.  I recommended examination under anesthesia and surgical treatment:  The anatomy & physiology of the anorectal region was discussed.  The pathophysiology of hemorrhoids and differential diagnosis was discussed.  Natural history risks without surgery was discussed.   I stressed the importance of a bowel regimen to have daily soft bowel movements to minimize progression of disease.  Interventions such as sclerotherapy & banding were discussed.  The patient's symptoms are not adequately controlled by medicines and other non-operative  treatments.  I feel the risks & problems of no surgery outweigh the operative risks; therefore, I recommended surgery to treat the hemorrhoids by ligation, pexy, and possible resection.  Risks such as bleeding, infection, need for further treatment, heart attack, death, and other risks were discussed.   I noted a good likelihood this will help address the problem.  Goals of post-operative recovery were discussed as well.  Possibility that this will not correct all symptoms was explained.  Post-operative pain, bleeding, constipation, urinary difficulties, and other problems after surgery were discussed.  We will work to minimize complications.   Educational handouts further explaining the pathology, treatment options, and bowel regimen were given as well.  Questions were answered.  The patient expresses understanding & wishes to proceed with surgery.  OR FINDINGS: Very enlarged hemorrhoids (right posterior > left lateral > right anterior) grade 4 with significant external component.    No anal fissure, fistula, sphincter dysfunction.  No proctitis.  No rectal masses to 10 cm.  No pilonidal disease.  No condyloma.  DESCRIPTION:   Informed consent was confirmed. Patient underwent general anesthesia without difficulty. Patient was placed into prone positioning.  The perianal region was prepped and draped in sterile fashion. Surgical time-out confirmed our plan.  I did digital rectal examination and then transitioned over to anoscopy to get a sense of the anatomy.  Findings noted above.   I proceeded to do hemorrhoidal ligation and pexy.  I used a 2-0 Vicryl suture on a UR-6 needle in a figure-of-eight fashion 6 cm proximal to the anal verge.  I started at the largest right posterior hemorrhoid pile.  Because of redundant hemorrhoidal tissue too bulky to merely ligate or pexy, I excised the excess internal & external  hemorrhoid pile longitudinally in a fusiform biconcave fashion,sparing the anal canal to  avoid narrowing.  I then ran that stitch longitudinally more distally to close the hemorrhoidectomy wound to the anal verge over a Parks self retaining retractor & occasionally a large Hill-Furgeson retractor to avoid narrowing of the anal canal.  I then tied that stitch down to cause a hemorrhoidopexy.   I also did that on the left lateral and right anterior piles as well.  I then did hemorrhoidal ligation and pexy at the other 3 columns.  At the completion of this, all 6 anorectal columns were ligated and pexied in the classic hexagonal fashion (right anterior/lateral/posterior, left anterior/lateral/posterior).  I ended up having to excise some additional hemorrhoidal submucosal piles right posterior and left lateral, sparing the anoderm.  I closed the external part of the hemorrhoidectomy wounds with interrupted horizontal mattress 2-0 chromic suture over a large Sawyer anal retractor, leaving the last 5 mm open to allow natural drainage.    I redid anoscopy & examination.  At completion of this, all hemorrhoids had been removed or reduced into the rectum.  There is no more prolapse.  Internal & external anatomy was more more normal.  Hemostasis was good.  Fluffed gauze was on-laid over the perianal region.  No packing done.  Patient is being extubated go to go to the recovery room.  I had discussed postop care in detail with the patient in the preop holding area.  I made an attempt to call family (pt's daughter, Raquel Sarna) to discuss patient's status and recommendations.  No one is available at this time.  Instructions for post-operative recovery and prescriptions are written.  [name], M.D., F.A.C.S. Gastrointestinal and Minimally Invasive Surgery [facility] [address] [place], [place] [id number] [phone] Main / Paging

## 2019-08-11 NOTE — Transfer of Care (Signed)
Immediate Anesthesia Transfer of Care Note  Patient: Destiny Jacobson  Procedure(s) Performed: ANORECTAL EXAM UNDER ANESTHESIA, HEMORRHOIDAL LIGATION AND HEMORRHOIDECTOMY (N/A Rectum)  Patient Location: PACU  Anesthesia Type:General  Level of Consciousness: awake, alert , oriented and patient cooperative  Airway & Oxygen Therapy: Patient Spontanous Breathing and Patient connected to nasal cannula oxygen  Post-op Assessment: Report given to RN and Post -op Vital signs reviewed and stable  Post vital signs: Reviewed and stable  Last Vitals:  Vitals Value Taken Time  BP 127/54 08/11/19 1307  Temp    Pulse 98 08/11/19 1310  Resp 18 08/11/19 1310  SpO2 85 % 08/11/19 1310  Vitals shown include unvalidated device data.  Last Pain:  Vitals:   08/11/19 0923  TempSrc: Oral  PainSc: 0-No pain      Patients Stated Pain Goal: 7 (08/11/19 9292)  Complications: No apparent anesthesia complications

## 2019-08-12 LAB — SURGICAL PATHOLOGY
